# Patient Record
Sex: Female | Born: 1965 | Race: White | Hispanic: No | Smoking: Never smoker
Health system: Southern US, Community
[De-identification: ages and names within clinical notes are randomized; demographics above are authoritative.]

## PROBLEM LIST (undated history)

## (undated) DIAGNOSIS — G4730 Sleep apnea, unspecified: Secondary | ICD-10-CM

## (undated) DIAGNOSIS — T7840XA Allergy, unspecified, initial encounter: Secondary | ICD-10-CM

## (undated) DIAGNOSIS — E538 Deficiency of other specified B group vitamins: Secondary | ICD-10-CM

## (undated) DIAGNOSIS — E559 Vitamin D deficiency, unspecified: Secondary | ICD-10-CM

## (undated) DIAGNOSIS — E669 Obesity, unspecified: Secondary | ICD-10-CM

## (undated) DIAGNOSIS — J45909 Unspecified asthma, uncomplicated: Secondary | ICD-10-CM

## (undated) DIAGNOSIS — E079 Disorder of thyroid, unspecified: Secondary | ICD-10-CM

## (undated) HISTORY — PX: URETHRA SURGERY: SHX824

## (undated) HISTORY — DX: Vitamin D deficiency, unspecified: E55.9

## (undated) HISTORY — DX: Sleep apnea, unspecified: G47.30

## (undated) HISTORY — DX: Deficiency of other specified B group vitamins: E53.8

## (undated) HISTORY — DX: Allergy, unspecified, initial encounter: T78.40XA

## (undated) HISTORY — DX: Unspecified asthma, uncomplicated: J45.909

## (undated) HISTORY — DX: Obesity, unspecified: E66.9

## (undated) HISTORY — PX: WISDOM TOOTH EXTRACTION: SHX21

---

## 2000-02-14 ENCOUNTER — Other Ambulatory Visit: Admission: RE | Admit: 2000-02-14 | Discharge: 2000-02-14 | Payer: Self-pay | Admitting: Family Medicine

## 2007-03-11 ENCOUNTER — Ambulatory Visit: Payer: Self-pay | Admitting: Internal Medicine

## 2007-04-01 ENCOUNTER — Ambulatory Visit: Payer: Self-pay | Admitting: Internal Medicine

## 2009-12-20 IMAGING — US US PELV - US TRANSVAGINAL
1 series · 17 of 25 positions shown · non-contrast
Comparison: none

REASON FOR EXAM: Uterine enlargement, post void residual bladder,
recurrent UTI
COMMENTS:

[Series 1: us pelv - us transvaginal · 17 of 37 slices shown]
[im 1/37]
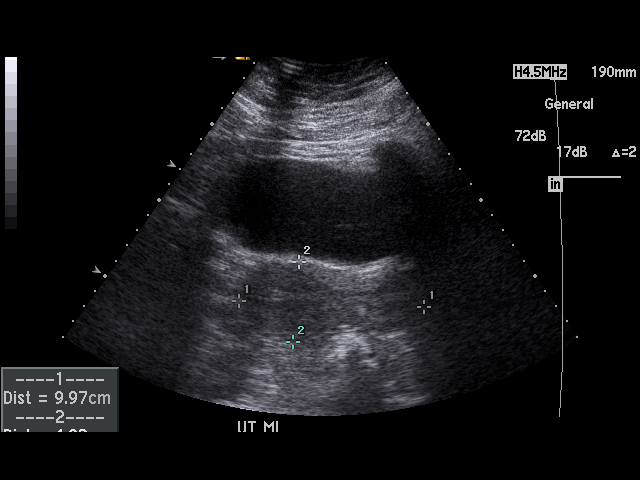
[im 4/37]
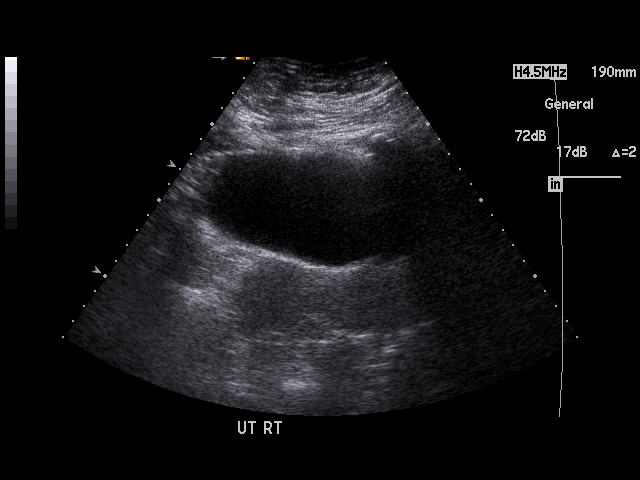
[im 5/37]
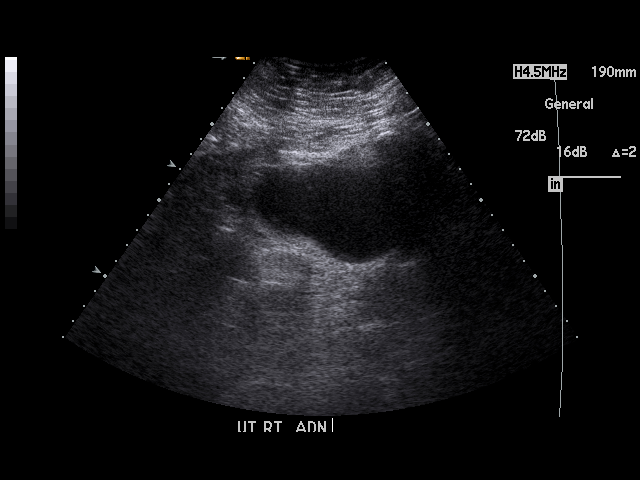
[im 8/37]
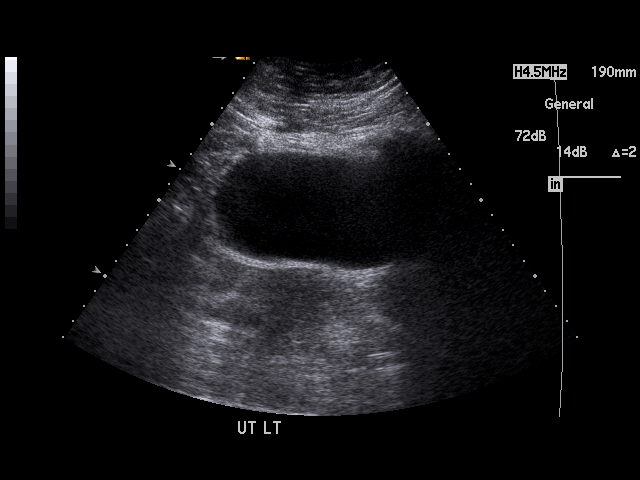
[im 10/37]
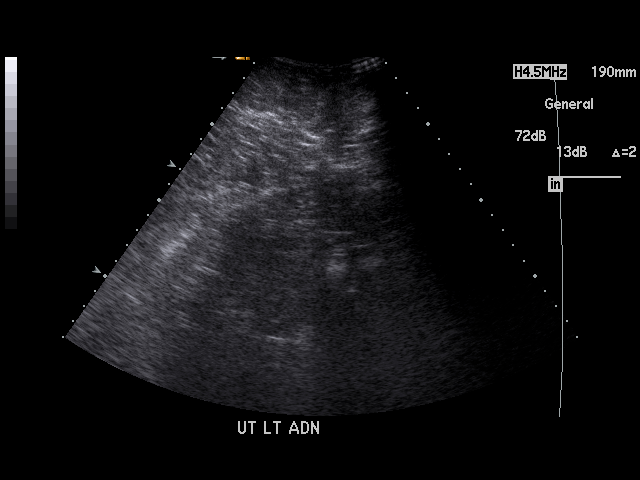
[im 13/37]
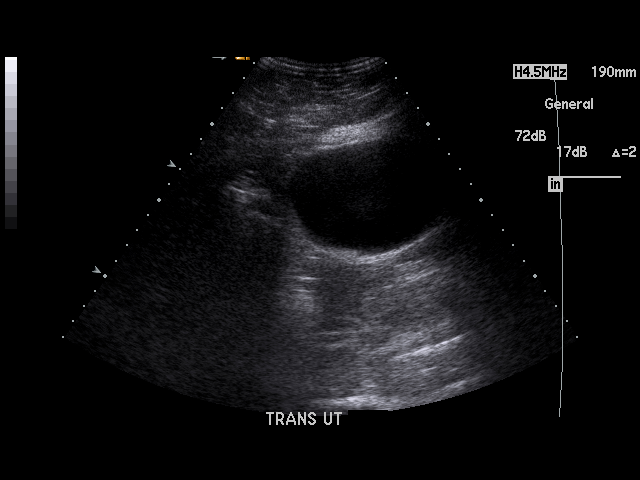
[im 14/37]
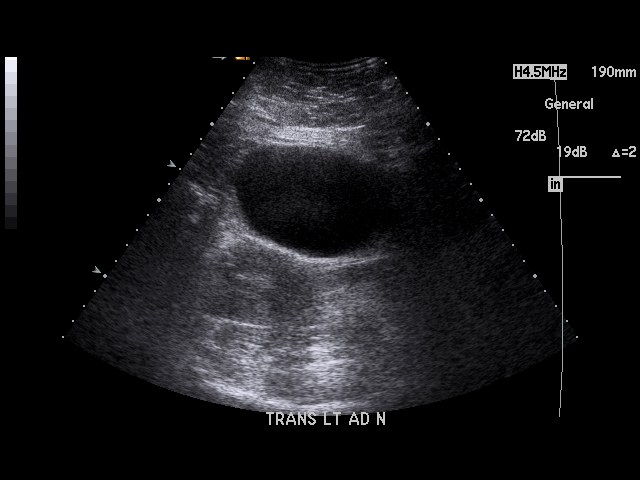
[im 17/37]
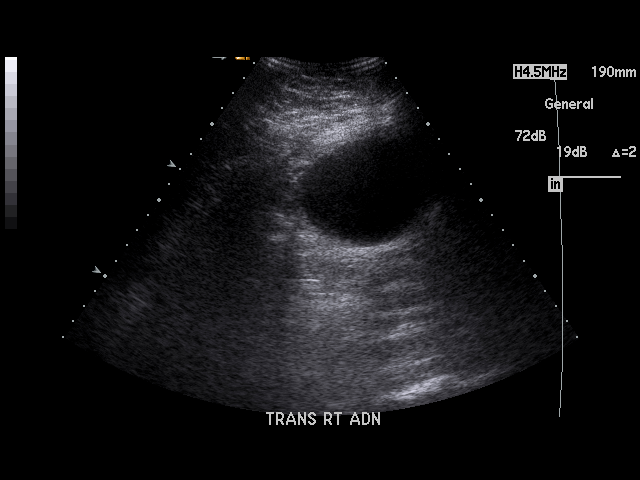
[im 19/37]
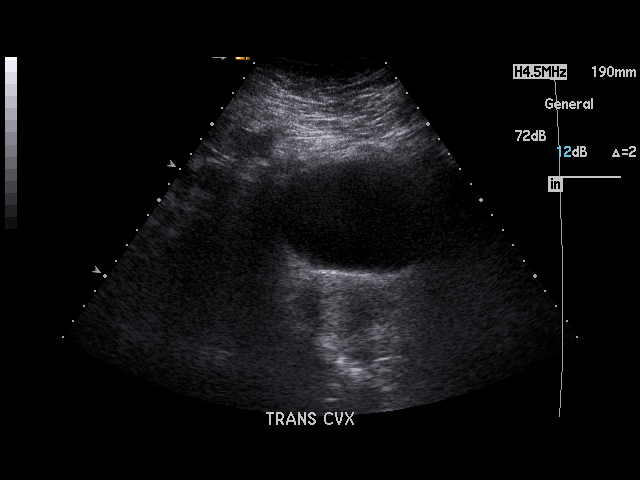
[im 20/37]
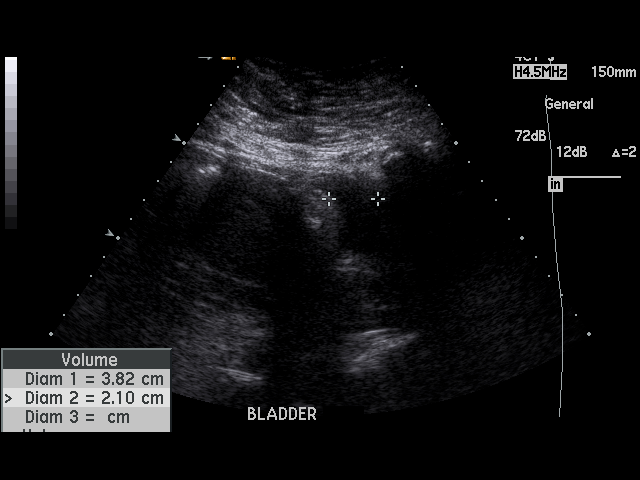
[im 23/37]
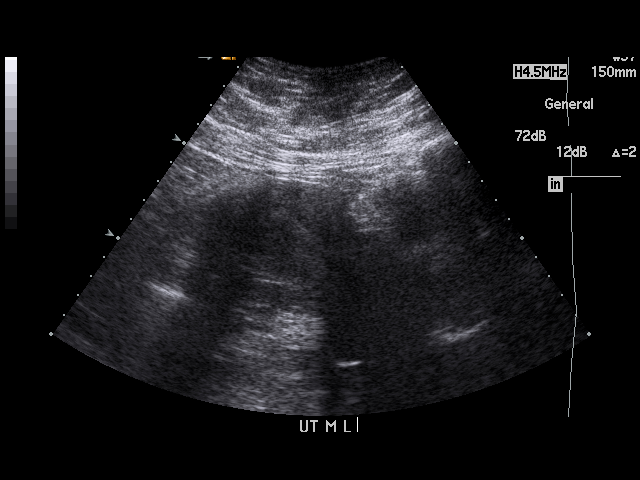
[im 25/37]
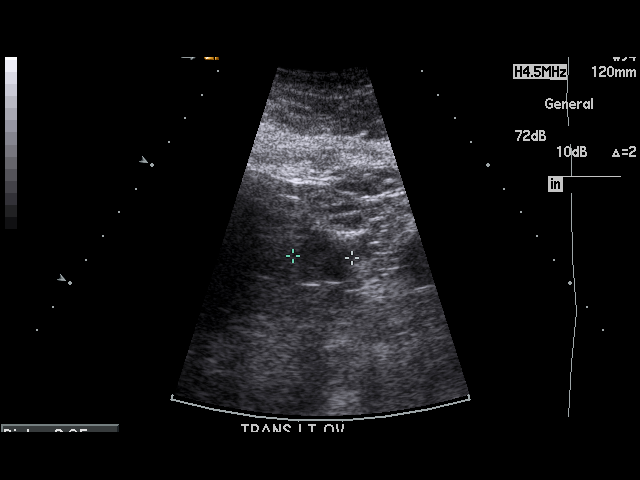
[im 28/37]
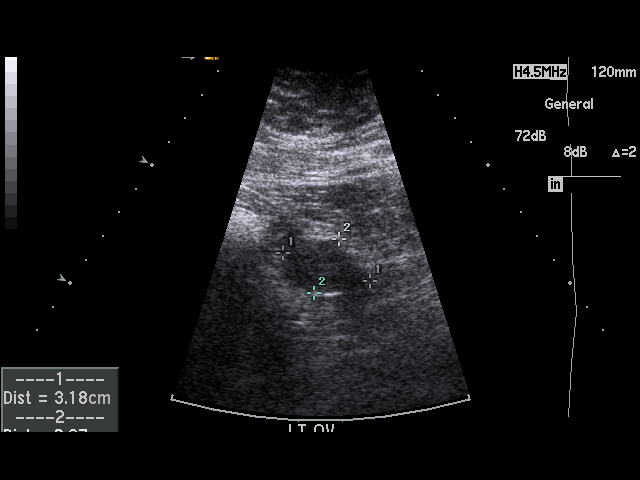
[im 29/37]
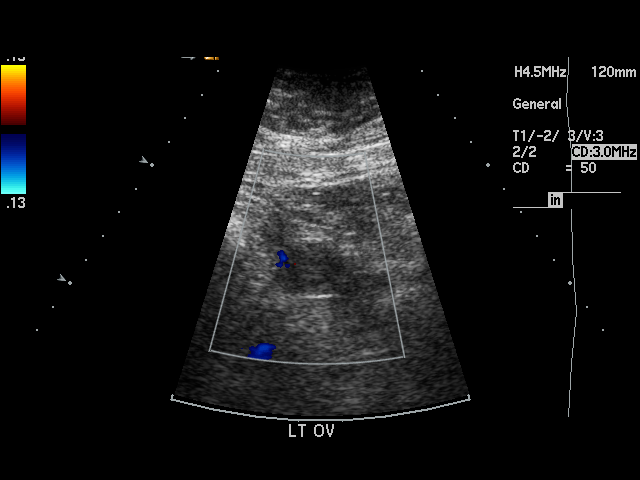
[im 32/37]
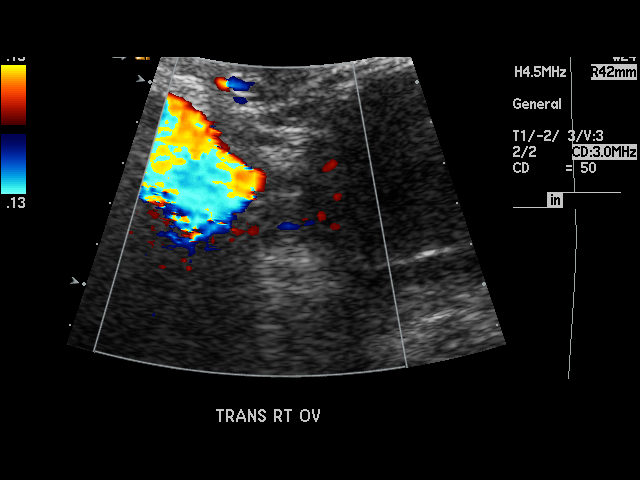
[im 34/37]
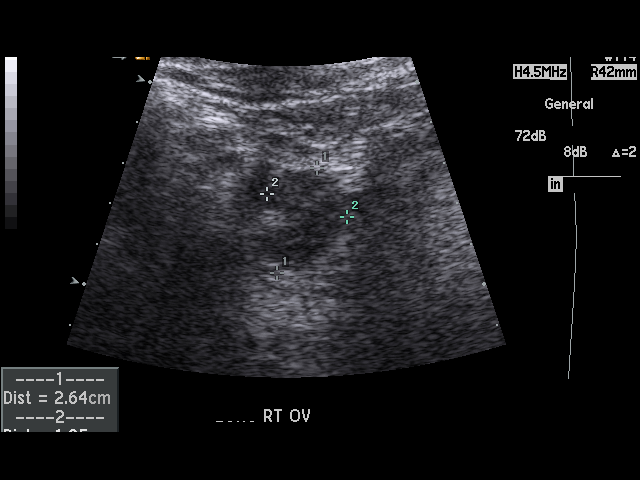
[im 37/37]
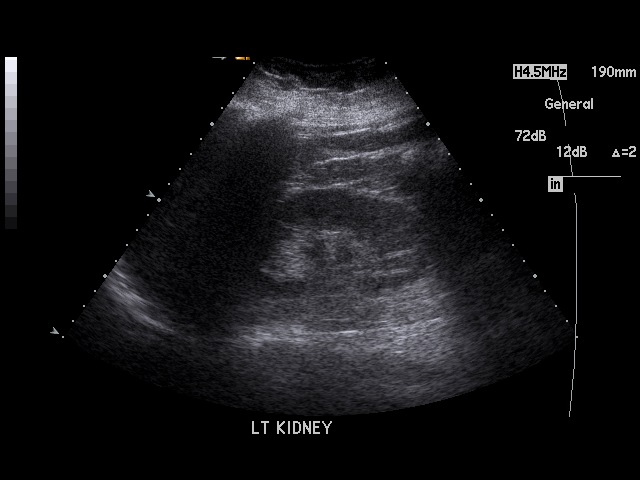

[17 of 25 positions shown; findings below may reference images not displayed]

PROCEDURE:     US  - US PELVIS MASS EXAM  - [DATE] [DATE] [DATE]  [DATE]

RESULT:     The uterus measures 9.97 cm x 5.36 cm x 7.34 cm. The endometrium
measures 7.0 mm in thickness. No uterine mass lesions are seen. The RIGHT
and LEFT ovaries are visualized. The RIGHT ovary measures 2.64 cm at maximum
diameter. The LEFT ovary measures 3.18 cm at maximum diameter. No abnormal
adnexal masses are seen. The kidneys show no hydronephrosis. The visualized
portion of the filled urinary bladder shows no significant abnormalities.
Post-void volume of the urinary bladder measures 22.51 cm.
IMPRESSION: 1.  No significant abnormalities are identified.
2.  The post-void urinary bladder volume measures 22.51 cm.

## 2011-03-06 DIAGNOSIS — G4733 Obstructive sleep apnea (adult) (pediatric): Secondary | ICD-10-CM | POA: Insufficient documentation

## 2012-02-09 ENCOUNTER — Ambulatory Visit: Payer: Self-pay | Admitting: Physician Assistant

## 2012-02-23 ENCOUNTER — Ambulatory Visit: Payer: Self-pay | Admitting: Physician Assistant

## 2012-10-18 ENCOUNTER — Encounter (HOSPITAL_COMMUNITY): Payer: Self-pay | Admitting: Emergency Medicine

## 2012-10-18 ENCOUNTER — Emergency Department (HOSPITAL_COMMUNITY)
Admission: EM | Admit: 2012-10-18 | Discharge: 2012-10-18 | Disposition: A | Discharge status: Home / Self Care | Payer: 59 | Attending: Emergency Medicine | Admitting: Emergency Medicine

## 2012-10-18 DIAGNOSIS — Y9389 Activity, other specified: Secondary | ICD-10-CM | POA: Insufficient documentation

## 2012-10-18 DIAGNOSIS — E079 Disorder of thyroid, unspecified: Secondary | ICD-10-CM | POA: Insufficient documentation

## 2012-10-18 DIAGNOSIS — T169XXA Foreign body in ear, unspecified ear, initial encounter: Secondary | ICD-10-CM | POA: Insufficient documentation

## 2012-10-18 DIAGNOSIS — Z79899 Other long term (current) drug therapy: Secondary | ICD-10-CM | POA: Insufficient documentation

## 2012-10-18 DIAGNOSIS — IMO0002 Reserved for concepts with insufficient information to code with codable children: Secondary | ICD-10-CM | POA: Insufficient documentation

## 2012-10-18 DIAGNOSIS — Y92009 Unspecified place in unspecified non-institutional (private) residence as the place of occurrence of the external cause: Secondary | ICD-10-CM | POA: Insufficient documentation

## 2012-10-18 DIAGNOSIS — T162XXA Foreign body in left ear, initial encounter: Secondary | ICD-10-CM

## 2012-10-18 HISTORY — DX: Disorder of thyroid, unspecified: E07.9

## 2012-10-18 MED ORDER — OFLOXACIN 0.3 % OT SOLN: 10.0000 [drp] | Freq: Every day | OTIC | Status: DC

## 2012-10-18 MED ORDER — OXYMETAZOLINE HCL 0.05 % NA SOLN
1.0000 | Freq: Once | NASAL | Status: AC
  Administered 2012-10-18: 1 via NASAL
  Filled 2012-10-18: qty 15

## 2012-10-18 NOTE — ED Notes (Signed)
Pt c/o having dead bug in left ear; pt sent from [facility]

## 2012-10-18 NOTE — ED Notes (Signed)
Pt states that she went to Urgent care where they used a solution to attempt to rinse the foreign body out of her ear. Urgent care was unsuccessful and she was sent to ED for further treatment. Pt states that she hears a whooshing sound in her ear since she had the solution instilled. Pt is mildly anxious and states she wants the bug out of her ear.

## 2012-10-18 NOTE — ED Notes (Signed)
Irrigation continued and using forceps PA removed foreign body. Ear canal is red and inflamed. Ear drum intact.

## 2012-10-18 NOTE — ED Provider Notes (Signed)
CSN: [id number]     Arrival date & time 10/18/12  1441 History  This chart was scribed for non-physician practitioner, Dierdre Forth, PA-C working with No att. providers found by Greggory Stallion, ED scribe. This patient was seen in room TR05C/TR05C and the patient's care was started at 3:16 PM.   Chief Complaint  Patient presents with  . Foreign Body in Ear   The history is provided by the patient and medical records. No language interpreter was used.   HPI Comments: Sarah Austin is a 47 y.o. female who presents to the Emergency Department complaining of an acute, persistent foreign body in her left ear that she noticed last night. She states she was trying to swat away a bug and accidentally swept it into her ear instead. Pt was sent here from Urgent Care because they were unable to flush it out. She has tried hydrogen peroxide and water without being able to get it out. Pt denies ear pain. Nothing makes it better or worse.    Past Medical History  Diagnosis Date  . Thyroid disease    History reviewed. No pertinent past surgical history. History reviewed. No pertinent family history. History  Substance Use Topics  . Smoking status: Never Smoker   . Smokeless tobacco: Not on file  . Alcohol Use: No   OB History   Grav Para Term Preterm Abortions TAB SAB Ect Mult Living                 Review of Systems  HENT: Negative for ear pain.   All other systems reviewed and are negative.    Allergies  Review of patient's allergies indicates no known allergies.  Home Medications   Current Outpatient Rx  Name  Route  Sig  Dispense  Refill  . albuterol (PROVENTIL HFA;VENTOLIN HFA) 108 (90 BASE) MCG/ACT inhaler   Inhalation   Inhale 2 puffs into the lungs every 6 (six) hours as needed for wheezing.         Marland Kitchen levothyroxine (SYNTHROID, LEVOTHROID) 125 MCG tablet   Oral   Take 125 mcg by mouth daily before breakfast.         . ofloxacin (FLOXIN) 0.3 % otic solution    Left Ear   Place 10 drops into the left ear daily.   5 mL   0     BP 147/84  Pulse 79  Temp(Src) 98.2 F (36.8 C) (Oral)  Resp 18  SpO2 96%  Physical Exam  Nursing note and vitals reviewed. Constitutional: She appears well-developed and well-nourished. No distress.  Awake, alert, nontoxic appearance  HENT:  Head: Normocephalic and atraumatic.  Right Ear: Hearing, tympanic membrane, external ear and ear canal normal.  Left Ear: External ear normal. A foreign body is present.  Nose: Nose normal.  Mouth/Throat: Uvula is midline, oropharynx is clear and moist and mucous membranes are normal. Mucous membranes are not dry. No oropharyngeal exudate, posterior oropharyngeal edema, posterior oropharyngeal erythema or tonsillar abscesses.  Visible insect in the canal of the left ear  Eyes: Conjunctivae are normal. No scleral icterus.  Neck: Normal range of motion. Neck supple.  Cardiovascular: Normal rate, regular rhythm, normal heart sounds and intact distal pulses.   No murmur heard. Pulmonary/Chest: Effort normal and breath sounds normal. No respiratory distress. She has no wheezes.  Abdominal: Soft. Bowel sounds are normal.  Musculoskeletal: Normal range of motion. She exhibits no edema.  Neurological: She is alert.  Speech is clear and goal oriented Moves  extremities without ataxia  Skin: Skin is warm and dry. She is not diaphoretic.  Psychiatric: She has a normal mood and affect.    ED Course  FOREIGN BODY REMOVAL Date/Time: 10/18/2012 4:00 PM Performed by: Dierdre Forth Authorized by: Dierdre Forth Consent: Verbal consent obtained. Risks and benefits: risks, benefits and alternatives were discussed Consent given by: patient Patient understanding: patient states understanding of the procedure being performed Patient consent: the patient's understanding of the procedure matches consent given Procedure consent: procedure consent matches procedure  scheduled Relevant documents: relevant documents present and verified Site marked: the operative site was marked Required items: required blood products, implants, devices, and special equipment available Patient identity confirmed: verbally with patient and arm band Time out: Immediately prior to procedure a "time out" was called to verify the correct patient, procedure, equipment, support staff and site/side marked as required. Body area: ear Location details: left ear Patient sedated: no Patient restrained: no Patient cooperative: yes Localization method: ENT speculum and visualized Removal mechanism: alligator forceps and irrigation Complexity: complex 1 objects recovered. Objects recovered: insect Post-procedure assessment: foreign body removed Patient tolerance: Patient tolerated the procedure well with no immediate complications.   (including critical care time)  DIAGNOSTIC STUDIES: Oxygen Saturation is 96% on RA, normal by my interpretation.    COORDINATION OF CARE: 3:23 PM-Discussed treatment plan which includes removing foreign body with pt at bedside and pt agreed to plan.   Labs Review Labs Reviewed - No data to display Imaging Review No results found.  EKG Interpretation   None       MDM   1. Foreign body in ear, left, initial encounter     Sarah Austin presents with ring body in the left ear. Patient with insect in the left ear removed with alligator forceps and irrigation after several attempts.    Reevaluation after foreign body removal shows the patient's left TM is erythematous but intact. Patient has a visible scratches and abrasions to the ear canal.  Patient with improved hearing after foreign body removal. Will discharge home with antibiotic drops.  Tetanus is UTD, <5 yrs.  Patient complaining of her left ear feeling "stopped up."  Patient given afrin here in the emergency department with relief of this feeling. Recommend primary care followup for  further evaluation as needed.  It has been determined that no acute conditions requiring further emergency intervention are present at this time. The patient/guardian have been advised of the diagnosis and plan. We have discussed signs and symptoms that warrant return to the ED, such as changes or worsening in symptoms.   Vital signs are stable at discharge.   BP 126/87  Pulse 72  Temp(Src) 96.9 F (36.1 C) (Oral)  Resp 19  SpO2 98%  Patient/guardian has voiced understanding and agreed to follow-up with the PCP or specialist.  I personally performed the services described in this documentation, which was scribed in my presence. The recorded information has been reviewed and is accurate.     Dahlia Client , PA-C 10/18/12 1636

## 2012-10-18 NOTE — ED Provider Notes (Signed)
  Medical screening examination/treatment/procedure(s) were performed by non-physician practitioner and as supervising physician I was immediately available for consultation/collaboration.    , MD 10/18/12 2208 

## 2013-07-14 LAB — HM PAP SMEAR: HM Pap smear: NEGATIVE

## 2013-07-14 LAB — HM MAMMOGRAPHY

## 2013-09-14 ENCOUNTER — Encounter: Payer: Self-pay | Admitting: Family Medicine

## 2013-09-14 ENCOUNTER — Ambulatory Visit (INDEPENDENT_AMBULATORY_CARE_PROVIDER_SITE_OTHER): Payer: 59 | Admitting: Family Medicine

## 2013-09-14 ENCOUNTER — Encounter (INDEPENDENT_AMBULATORY_CARE_PROVIDER_SITE_OTHER): Payer: Self-pay

## 2013-09-14 DIAGNOSIS — J4520 Mild intermittent asthma, uncomplicated: Secondary | ICD-10-CM

## 2013-09-14 DIAGNOSIS — Z833 Family history of diabetes mellitus: Secondary | ICD-10-CM

## 2013-09-14 DIAGNOSIS — J45909 Unspecified asthma, uncomplicated: Secondary | ICD-10-CM | POA: Insufficient documentation

## 2013-09-14 DIAGNOSIS — E039 Hypothyroidism, unspecified: Secondary | ICD-10-CM | POA: Insufficient documentation

## 2013-09-14 DIAGNOSIS — Z23 Encounter for immunization: Secondary | ICD-10-CM

## 2013-09-14 DIAGNOSIS — Z136 Encounter for screening for cardiovascular disorders: Secondary | ICD-10-CM

## 2013-09-14 MED ORDER — ACLIDINIUM BROMIDE 400 MCG/ACT IN AEPB: INHALATION_SPRAY | RESPIRATORY_TRACT | Status: DC

## 2013-09-14 NOTE — Patient Instructions (Addendum)
It was great to meet you. Please stop by to see Sarah Austin on your way out to set up your nutritionist appointment.  I will call you with your lab results or you can view them online.  Please come see me in 3 months.

## 2013-09-14 NOTE — Assessment & Plan Note (Signed)
Using rescue inhaler daily so does not seem to be under good control.  She just started inhaled steroid through urgent care.  Continue this for now, follow up in 3 months.  Consider restarting advair. The patient indicates understanding of these issues and agrees with the plan.

## 2013-09-14 NOTE — Progress Notes (Signed)
Pre visit review using our clinic review tool, if applicable. No additional management support is needed unless otherwise documented below in the visit note. 

## 2013-09-14 NOTE — Progress Notes (Signed)
Subjective:   Patient ID: Sarah Austin, female    DOB: 25-Feb-1965, 48 y.o.   MRN: [id number]  Sarah Austin is a pleasant 48 y.o. year old female who presents to clinic today with Establish Care  on 09/14/2013  HPI:  Hypothyroidism- has been on synthroid for year. Has been on 125 mcg daily for at least 2 years.  Not having her labs checked regularly. Having some fatigue, dry skin, more diarrhea. No other symptoms of hypo or hyperthyroidism. +FH of thyroid dysfunction.  Obesity-  Has been trying to lose weight. Walking three times a week.  Cutting back on caloric intake. Has tried "every diet"- including weight watchers.  Asthma- uses her rescue inhaler daily.  Also using aclidinium bromide- rx given to her at urgent care. Advair was effective in past.  Does have allergic asthma but has other triggers as well.  Current Outpatient Prescriptions on File Prior to Visit  Medication Sig Dispense Refill  . albuterol (PROVENTIL HFA;VENTOLIN HFA) 108 (90 BASE) MCG/ACT inhaler Inhale 2 puffs into the lungs every 6 (six) hours as needed for wheezing.      Marland Kitchen levothyroxine (SYNTHROID, LEVOTHROID) 125 MCG tablet Take 125 mcg by mouth daily before breakfast.       No current facility-administered medications on file prior to visit.    No Known Allergies  Past Medical History  Diagnosis Date  . Thyroid disease   . Asthma     Past Surgical History  Procedure Laterality Date  . Wisdom tooth extraction    . Urethra surgery      1969    Family History  Problem Relation Age of Onset  . Arthritis Mother   . Cancer Mother   . Hypertension Mother   . Diabetes Mother   . Heart disease Father   . Heart disease Sister   . Cancer Maternal Grandmother     History   Social History  . Marital Status: Married    Spouse Name: N/A    Number of Children: N/A  . Years of Education: N/A   Occupational History  . Not on file.   Social History Main Topics  . Smoking status: Never  Smoker   . Smokeless tobacco: Not on file  . Alcohol Use: No  . Drug Use: No  . Sexual Activity: No   Other Topics Concern  . Not on file   Social History Narrative  . No narrative on file   The PMH, PSH, Social History, Family History, Medications, and allergies have been reviewed in [facility], and have been updated if relevant.    Review of Systems See HPI +hot flashes +increased thirst and urination +diarrhea but no abdominal pain or blood in her stool No CP  No fevers No urinary symptoms No LE edema No HA No blurred vision     Objective:    BP 128/74  Pulse 70  Temp(Src) 97.7 F (36.5 C) (Oral)  Ht  (1.753 m)  Wt 309 lb 12 oz (140.502 kg)  BMI 45.72 kg/m2  SpO2 97%   Physical Exam  General:  Obese pleasant female, in no acute distress; alert,appropriate and cooperative throughout examination Head:  normocephalic and atraumatic.   Eyes:  vision grossly intact, pupils equal, pupils round, and pupils reactive to light.   Ears:  R ear normal and L ear normal.   Nose:  no external deformity.   Mouth:  good dentition.   Neck:  No deformities, masses, or tenderness noted.  Lungs:  Normal respiratory effort, chest expands symmetrically. Lungs are clear to auscultation, no crackles or wheezes. Heart:  Normal rate and regular rhythm. S1 and S2 normal without gallop, murmur, click, rub or other extra sounds. Abdomen:  Bowel sounds positive,abdomen soft and non-tender without masses, organomegaly or hernias noted. Msk:  No deformity or scoliosis noted of thoracic or lumbar spine.   Extremities:  No clubbing, cyanosis, edema, or deformity noted with normal full range of motion of all joints.   Neurologic:  alert & oriented X3 and gait normal.   Skin:  Intact without suspicious lesions or rashes Cervical Nodes:  No lymphadenopathy noted Axillary Nodes:  No palpable lymphadenopathy Psych:  Cognition and judgment appear intact. Alert and cooperative with normal attention  span and concentration. No apparent delusions, illusions, hallucinations        Assessment & Plan:   Morbid obesity - Plan: Comprehensive metabolic panel  Unspecified hypothyroidism - Plan: TSH, T4, Free, CBC with Differential  Screening for ischemic heart disease - Plan: Lipid panel  Need for prophylactic vaccination and inoculation against influenza - Plan: Flu Vaccine QUAD 36+ mos PF IM (Fluarix Quad PF)  Asthma, chronic, mild intermittent, uncomplicated No Follow-up on file.

## 2013-09-14 NOTE — Addendum Note (Signed)
Addended by: Liane Comber C on: 09/14/2013 02:00 PM   Modules accepted: Orders

## 2013-09-14 NOTE — Assessment & Plan Note (Signed)
Deteriorated- discussed exercise which she has been trying to do. Will also refer to nutritionist. Follow up with me in 3 months. The patient indicates understanding of these issues and agrees with the plan.

## 2013-09-14 NOTE — Assessment & Plan Note (Signed)
Pt feels she is hypothyroid- under corrected. Will check labs today. Orders Placed This Encounter  Procedures  . Flu Vaccine QUAD 36+ mos PF IM (Fluarix Quad PF)  . TSH  . T4, Free  . Comprehensive metabolic panel  . CBC with Differential  . Lipid panel  . Hemoglobin A1c  . Amb ref to Medical Nutrition Therapy-MNT

## 2013-09-14 NOTE — Addendum Note (Signed)
Addended by: Alvina Chou on: 09/14/2013 02:06 PM   Modules accepted: Orders

## 2013-09-16 ENCOUNTER — Telehealth: Payer: Self-pay

## 2013-09-16 LAB — CBC WITH DIFFERENTIAL/PLATELET
Basophils Absolute: 0 10*3/uL (ref 0.0–0.2)
Basos: 0 %
EOS: 1 %
Eosinophils Absolute: 0.1 10*3/uL (ref 0.0–0.4)
HEMATOCRIT: 38.8 % (ref 34.0–46.6)
HEMOGLOBIN: 12.9 g/dL (ref 11.1–15.9)
Immature Grans (Abs): 0 10*3/uL (ref 0.0–0.1)
Immature Granulocytes: 0 %
LYMPHS ABS: 1.8 10*3/uL (ref 0.7–3.1)
Lymphs: 24 %
MCH: 29.1 pg (ref 26.6–33.0)
MCHC: 33.2 g/dL (ref 31.5–35.7)
MCV: 88 fL (ref 79–97)
Monocytes Absolute: 0.4 10*3/uL (ref 0.1–0.9)
Monocytes: 5 %
NEUTROS ABS: 5.4 10*3/uL (ref 1.4–7.0)
Neutrophils Relative %: 70 %
RBC: 4.43 x10E6/uL (ref 3.77–5.28)
RDW: 13.7 % (ref 12.3–15.4)
WBC: 7.8 10*3/uL (ref 3.4–10.8)

## 2013-09-16 LAB — COMPREHENSIVE METABOLIC PANEL
A/G RATIO: 1.8 (ref 1.1–2.5)
ALBUMIN: 4.4 g/dL (ref 3.5–5.5)
ALK PHOS: 96 IU/L (ref 39–117)
ALT: 24 IU/L (ref 0–32)
AST: 15 IU/L (ref 0–40)
BILIRUBIN TOTAL: 0.6 mg/dL (ref 0.0–1.2)
BUN / CREAT RATIO: 11 (ref 9–23)
BUN: 10 mg/dL (ref 6–24)
CO2: 23 mmol/L (ref 18–29)
CREATININE: 0.94 mg/dL (ref 0.57–1.00)
Calcium: 9.1 mg/dL (ref 8.7–10.2)
Chloride: 103 mmol/L (ref 97–108)
GFR calc non Af Amer: 71 mL/min/{1.73_m2} (ref >59)
GFR, EST AFRICAN AMERICAN: 82 mL/min/{1.73_m2} (ref >59)
GLOBULIN, TOTAL: 2.5 g/dL (ref 1.5–4.5)
Glucose: 84 mg/dL (ref 65–99)
Potassium: 4.1 mmol/L (ref 3.5–5.2)
SODIUM: 142 mmol/L (ref 134–144)
Total Protein: 6.9 g/dL (ref 6.0–8.5)

## 2013-09-16 LAB — LIPID PANEL
Chol/HDL Ratio: 3.9 ratio units (ref 0.0–4.4)
Cholesterol, Total: 202 mg/dL — ABNORMAL HIGH (ref 100–199)
HDL: 52 mg/dL (ref >39)
LDL Calculated: 129 mg/dL — ABNORMAL HIGH (ref 0–99)
Triglycerides: 104 mg/dL (ref 0–149)
VLDL Cholesterol Cal: 21 mg/dL (ref 5–40)

## 2013-09-16 LAB — HEMOGLOBIN A1C
Est. average glucose Bld gHb Est-mCnc: 108 mg/dL
Hgb A1c MFr Bld: 5.4 % (ref 4.8–5.6)

## 2013-09-16 LAB — T4, FREE: Free T4: 1.19 ng/dL (ref 0.82–1.77)

## 2013-09-16 LAB — TSH: TSH: 1.97 u[IU]/mL (ref 0.450–4.500)

## 2013-09-16 NOTE — Telephone Encounter (Signed)
Pt request cb [phone] when lab results are back.

## 2013-09-17 ENCOUNTER — Encounter: Payer: Self-pay | Admitting: *Deleted

## 2013-09-18 NOTE — Telephone Encounter (Signed)
Her thyroid function is normal so I am not comfortable with increasing her thyroid medication dosage.  I can certainly send her to a specialist, an endocrinologist, for further evaluation and testing that would be outside my scope of practice.  Let me know and I will place referral.

## 2013-09-18 NOTE — Telephone Encounter (Signed)
Attempted to contact pt, unable to leave a message

## 2013-09-18 NOTE — Telephone Encounter (Signed)
Pt request recent lab results; pt notified as instructed from result note and advised pt copy of labs had been mailed to pt. Pt said she is feeling very tired and wants to know if thyroid med needs to be increased; pt is presently taking 125 mcg. Pt request cb. [facility].

## 2013-09-22 NOTE — Telephone Encounter (Signed)
Pt called back and advised as instructed. Pt feels that thyroid testing is on low end of normal and thinks thyroid med needs to be increased; pt will ck about endocrinologist and decide who she wants to see and if needs referral pt will cb.

## 2013-09-25 ENCOUNTER — Telehealth: Payer: Self-pay

## 2013-09-25 DIAGNOSIS — E039 Hypothyroidism, unspecified: Secondary | ICD-10-CM

## 2013-09-25 NOTE — Telephone Encounter (Signed)
Referral placed.

## 2013-09-25 NOTE — Telephone Encounter (Signed)
Pt left v/m requesting referral to [facility] endocrinologist at [facility] to get thyroid adjusted.Please advise.

## 2013-12-14 ENCOUNTER — Ambulatory Visit: Payer: 59 | Admitting: Family Medicine

## 2014-01-11 ENCOUNTER — Ambulatory Visit (INDEPENDENT_AMBULATORY_CARE_PROVIDER_SITE_OTHER): Payer: 59 | Admitting: Family Medicine

## 2014-01-11 ENCOUNTER — Encounter: Payer: Self-pay | Admitting: Family Medicine

## 2014-01-11 VITALS — BP 130/74 | HR 81 | Temp 97.6°F | Wt 316.5 lb

## 2014-01-11 DIAGNOSIS — R350 Frequency of micturition: Secondary | ICD-10-CM

## 2014-01-11 LAB — POCT URINALYSIS DIPSTICK
Bilirubin, UA: NEGATIVE
Glucose, UA: NEGATIVE
Ketones, UA: NEGATIVE
LEUKOCYTES UA: NEGATIVE
Nitrite, UA: NEGATIVE
Protein, UA: NEGATIVE
Spec Grav, UA: >=1.03
Urobilinogen, UA: 0.2
pH, UA: 6

## 2014-01-11 MED ORDER — PHENTERMINE HCL 15 MG PO CAPS: 15.0000 mg | ORAL_CAPSULE | ORAL | Status: DC

## 2014-01-11 MED ORDER — CIPROFLOXACIN HCL 250 MG PO TABS: 250.0000 mg | ORAL_TABLET | Freq: Two times a day (BID) | ORAL | Status: DC

## 2014-01-11 NOTE — Progress Notes (Signed)
Patient ID: Sarah Austin, female   DOB: 25-Sep-1965, 49 y.o.   MRN: [id number]   Subjective:   Patient ID: Sarah Austin, female    DOB: [birth year], 49 y.o.   MRN: [id number]  Sarah Austin is a pleasant 49 y.o. year old female who presents to clinic today with Follow-up and Urinary Frequency  on 01/11/2014  HPI: I have not seen her since she established care with me in 09/2013.  Morbid obesity- trying to lose weight.  I referred her to a nutritionist at our last office visit.  She did not feel it was helpful- she thought it would be one on one but it was a group class.  Wants to talk about other options.  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 Wt Readings from Last 3 Encounters:  01/11/14 316 lb 8 oz (143.563 kg)  09/14/13 309 lb 12 oz (140.502 kg)     Lab Results  Component Value Date   HGBA1C 5.4 09/14/2013     Urinary frequency- 3 days of urinary frequency, dysuria, back pain.  No fevers.  Hypothyroidism- Taking synthroid 125 mcg daily. Lab Results  Component Value Date   TSH 1.970 09/14/2013   Denies any symptoms of hypo or hyperthyroidism.  Current Outpatient Prescriptions on File Prior to Visit  Medication Sig Dispense Refill  . Aclidinium Bromide 400 MCG/ACT AEPB 1 puff daily 1 each 3  . albuterol (PROVENTIL HFA;VENTOLIN HFA) 108 (90 BASE) MCG/ACT inhaler Inhale 2 puffs into the lungs every 6 (six) hours as needed for wheezing.    Marland Kitchen levothyroxine (SYNTHROID, LEVOTHROID) 125 MCG tablet Take 125 mcg by mouth daily before breakfast.     No current facility-administered medications on file prior to visit.    No Known Allergies  Past Medical History  Diagnosis Date  . Thyroid disease   . Asthma     Past Surgical History  Procedure Laterality Date  . Wisdom tooth extraction    . Urethra surgery      1969    Family History  Problem Relation Age of Onset  . Arthritis Mother   . Cancer Mother   . Hypertension Mother   . Diabetes Mother   . Heart disease Father    . Heart disease Sister   . Cancer Maternal Grandmother     History   Social History  . Marital Status: Divorced    Spouse Name: N/A    Number of Children: N/A  . Years of Education: N/A   Occupational History  . Not on file.   Social History Main Topics  . Smoking status: Never Smoker   . Smokeless tobacco: Not on file  . Alcohol Use: No  . Drug Use: No  . Sexual Activity: No   Other Topics Concern  . Not on file   Social History Narrative   The PMH, PSH, Social History, Family History, Medications, and allergies have been reviewed in [facility], and have been updated if relevant.    Review of Systems  Constitutional: Negative.   HENT: Negative.   Respiratory: Negative.   Cardiovascular: Negative.   Genitourinary: Positive for dysuria, urgency and flank pain. Negative for vaginal bleeding, vaginal discharge and pelvic pain.  Musculoskeletal: Negative.   Skin: Negative.   Allergic/Immunologic: Negative.   Neurological: Negative.   Hematological: Negative.   Psychiatric/Behavioral: Negative.   All other systems reviewed and are negative.      Objective:    BP 130/74 mmHg  Pulse 81  Temp(Src) 97.6  F (36.4 C) (Oral)  Wt 316 lb 8 oz (143.563 kg)  SpO2 96%   Physical Exam  Constitutional: She is oriented to person, place, and time. She appears well-developed and well-nourished.  Morbidly obese  HENT:  Head: Normocephalic.  Eyes: Conjunctivae are normal.  Neck: Normal range of motion.  Cardiovascular: Normal rate and regular rhythm.   Pulmonary/Chest: Effort normal and breath sounds normal.  Abdominal: Soft. Bowel sounds are normal.  Mild suprapubic tenderness No CVA tenderness  Musculoskeletal: Normal range of motion.  Neurological: She is alert and oriented to person, place, and time.  Skin: Skin is warm and dry.  Psychiatric: She has a normal mood and affect. Her behavior is normal. Judgment and thought content normal.          Assessment &  Plan:   Urinary frequency - Plan: Urinalysis Dipstick No Follow-up on file.

## 2014-01-11 NOTE — Assessment & Plan Note (Signed)
Deteriorated. Discussed weight loss plan.  Pt would also like to discuss medication options- discussed phentermine risk benefits, side effects including HTN, pulmonary HTN, stroke.    She would like to start phentermine and lifestyle changes.  Follow up in 1 month.  If BMI < 27 will decrease to half dose x 1 month then stop

## 2014-01-11 NOTE — Progress Notes (Signed)
Pre visit review using our clinic review tool, if applicable. No additional management support is needed unless otherwise documented below in the visit note. 

## 2014-01-11 NOTE — Assessment & Plan Note (Signed)
New- UA pos for RBCs and has classic UTI symptoms. Treat with cipro 500 mg twice daily x 3 days, send urine for cx. Call or return to clinic prn if these symptoms worsen or fail to improve as anticipated. The patient indicates understanding of these issues and agrees with the plan.

## 2014-01-11 NOTE — Patient Instructions (Signed)
Great to see you. We are starting phentermine 15 mg daily- please come see me in one month.  We are also starting cipro 500 mg twice daily x 3 days.  We will call you with your urine culture results.

## 2014-01-13 LAB — URINE CULTURE
Colony Count: NO GROWTH
ORGANISM ID, BACTERIA: NO GROWTH

## 2014-01-14 ENCOUNTER — Other Ambulatory Visit: Payer: Self-pay

## 2014-01-14 NOTE — Telephone Encounter (Signed)
Pt left v/m; pt seen 01/11/14 and pt finished abx this morning and pt request refill abx due to still experiencing some discomfort. Please advise. [facility].

## 2014-02-22 ENCOUNTER — Ambulatory Visit: Payer: 59 | Admitting: Family Medicine

## 2014-03-22 ENCOUNTER — Ambulatory Visit: Payer: 59 | Admitting: Family Medicine

## 2014-05-19 HISTORY — PX: ENDOMETRIAL BIOPSY: SHX622

## 2014-06-15 ENCOUNTER — Encounter: Payer: Self-pay | Admitting: Obstetrics and Gynecology

## 2014-06-15 ENCOUNTER — Ambulatory Visit (INDEPENDENT_AMBULATORY_CARE_PROVIDER_SITE_OTHER): Payer: 59 | Admitting: Obstetrics and Gynecology

## 2014-06-15 VITALS — BP 133/84 | HR 71 | Ht 69.0 in | Wt 315.3 lb

## 2014-06-15 DIAGNOSIS — N840 Polyp of corpus uteri: Secondary | ICD-10-CM | POA: Diagnosis not present

## 2014-06-15 DIAGNOSIS — N950 Postmenopausal bleeding: Secondary | ICD-10-CM | POA: Diagnosis not present

## 2014-06-15 DIAGNOSIS — J302 Other seasonal allergic rhinitis: Secondary | ICD-10-CM | POA: Insufficient documentation

## 2014-06-15 DIAGNOSIS — R638 Other symptoms and signs concerning food and fluid intake: Secondary | ICD-10-CM

## 2014-06-15 NOTE — Progress Notes (Signed)
Patient ID: Sarah Austin, female   DOB: 04/07/1965, 49 y.o.   MRN: [id number]   REFERRAL  FROM [facility] BLEEDING X 1- NEG ENDOMETRIAL BX-05/19/2014- benign endometrial polyps  fsh- 76.4 U/S LAST YEAR- FIBROIDS SEEN WANTS D&C   GYN ENCOUNTER NOTE  Subjective:       Sarah Austin is a 49 y.o. No obstetric history on file. female is here for gynecologic evaluation of the following issues:  1. Postmenopausal bleeding. 2.  Endometrial polyp on endometrial biopsy.  The patient is a 49 year old white divorced female, para 66, who presents in referral from Dr. Willeen Cass for evaluation and management of postmenopausal bleeding. Patient had episode of heavy bleeding with clots and cramps following an 8 month period of amenorrhea.  FSH level was 78, consistent with menopause.  Endometrial biopsy demonstrated fragments of endometrial polyp.  Patient was advised to proceed with hysteroscopy/D&C.  Patient has family history of endometrial cancer in mother.  No history of colon, ovary or breast cancer in her family.   Gynecologic History Patient's last menstrual period was 07/01/2013. Contraception: post menopausal status   Menarche age 64. Cycles were regular until 1 year ago when they became infrequent and irregular. No history of dysmenorrhea. No history of abnormal Pap smears. Ultrasound approximately 1 year ago was notable for fibroids and polyps.(Record unavailable for review)  Obstetric History OB History  No data available    Past Medical History  Diagnosis Date  . Thyroid disease   . Asthma   . Obesity   . Allergy     Past Surgical History  Procedure Laterality Date  . Wisdom tooth extraction    . Urethra surgery      1969  . Endometrial biopsy  05/19/2014    at [facility]- benign endometrial polyps    Current Outpatient Prescriptions on File Prior to Visit  Medication Sig Dispense Refill  . levothyroxine (SYNTHROID, LEVOTHROID) 125 MCG tablet Take 150 mcg by mouth daily  before breakfast.      No current facility-administered medications on file prior to visit.    No Known Allergies  History   Social History  . Marital Status: Divorced    Spouse Name: N/A  . Number of Children: N/A  . Years of Education: N/A   Occupational History  . Not on file.   Social History Main Topics  . Smoking status: Never Smoker   . Smokeless tobacco: Not on file  . Alcohol Use: No  . Drug Use: No  . Sexual Activity: No   Other Topics Concern  . Not on file   Social History Narrative    Family History  Problem Relation Age of Onset  . Arthritis Mother   . Cancer Mother   . Hypertension Mother   . Diabetes Mother   . Uterine cancer Mother   . Heart disease Father   . Heart disease Sister   . Cancer Maternal Grandmother   . Breast cancer Neg Hx   . Ovarian cancer Neg Hx   . Colon cancer Neg Hx     The following portions of the patient's history were reviewed and updated as appropriate: allergies, current medications, past family history, past medical history, past social history, past surgical history and problem list.  Review of Systems Review of Systems - General ROS: negative for - chills, fatigue, fever, hot flashes, malaise or night sweats Hematological and Lymphatic ROS: negative for - bleeding problems or swollen lymph nodes Gastrointestinal ROS: negative for - abdominal  pain, blood in stools, change in bowel habits and nausea/vomiting Musculoskeletal ROS: negative for - joint pain, muscle pain or muscular weakness Genito-Urinary ROS: negative for -  dysmenorrhea, dyspareunia, dysuria, genital discharge, genital ulcers, hematuria, incontinence, irregular/heavy menses, nocturia or pelvic pain. POSITIVE for postmenopausal bleeding.  Objective:   BP 133/84 mmHg  Pulse 71  Ht  (1.753 m)  Wt 315 lb 4.8 oz (143.019 kg)  BMI 46.54 kg/m2  LMP 07/01/2013 CONSTITUTIONAL: Well-developed, well-nourished female in no acute distress.  Physical  exam deferred until preop appointment.    Assessment:   1. Postmenopausal bleeding 2.  History of endometrial polyp on biopsy. 3.  Increased BMI. 4.  Family history of endometrial cancer   Plan:  1.  Schedule hysteroscopy/D&C with polypectomy. 2.  Literature regarding hysteroscopy/D&C and abnormal uterine bleeding. 3.  Return for preop appointment  A total of 30 minutes were spent face-to-face with the patient during the encounter with greater than 50% dealing with counseling and coordination of care.

## 2014-07-07 ENCOUNTER — Ambulatory Visit: Admission: RE | Admit: 2014-07-07 | Payer: 59 | Source: Ambulatory Visit

## 2014-07-07 ENCOUNTER — Ambulatory Visit (INDEPENDENT_AMBULATORY_CARE_PROVIDER_SITE_OTHER): Payer: 59 | Admitting: Obstetrics and Gynecology

## 2014-07-07 ENCOUNTER — Ambulatory Visit
Admission: RE | Admit: 2014-07-07 | Discharge: 2014-07-07 | Disposition: A | Discharge status: Home / Self Care | Payer: 59 | Source: Ambulatory Visit | Attending: Obstetrics and Gynecology | Admitting: Obstetrics and Gynecology

## 2014-07-07 ENCOUNTER — Encounter
Admission: RE | Admit: 2014-07-07 | Discharge: 2014-07-07 | Disposition: A | Discharge status: Home / Self Care | Payer: 59 | Source: Ambulatory Visit | Attending: Obstetrics and Gynecology | Admitting: Obstetrics and Gynecology

## 2014-07-07 ENCOUNTER — Encounter: Payer: Self-pay | Admitting: Obstetrics and Gynecology

## 2014-07-07 VITALS — BP 118/78 | HR 65 | Ht 69.0 in | Wt 312.4 lb

## 2014-07-07 DIAGNOSIS — Z01818 Encounter for other preprocedural examination: Secondary | ICD-10-CM | POA: Diagnosis present

## 2014-07-07 DIAGNOSIS — N950 Postmenopausal bleeding: Secondary | ICD-10-CM

## 2014-07-07 DIAGNOSIS — J42 Unspecified chronic bronchitis: Secondary | ICD-10-CM | POA: Insufficient documentation

## 2014-07-07 DIAGNOSIS — E669 Obesity, unspecified: Secondary | ICD-10-CM | POA: Insufficient documentation

## 2014-07-07 DIAGNOSIS — J45909 Unspecified asthma, uncomplicated: Secondary | ICD-10-CM

## 2014-07-07 DIAGNOSIS — N840 Polyp of corpus uteri: Secondary | ICD-10-CM

## 2014-07-07 LAB — CBC WITH DIFFERENTIAL/PLATELET
Basophils Absolute: 0 10*3/uL (ref 0–0.1)
Basophils Relative: 0 %
EOS PCT: 1 %
Eosinophils Absolute: 0 10*3/uL (ref 0–0.7)
HEMATOCRIT: 38.9 % (ref 35.0–47.0)
Hemoglobin: 12.8 g/dL (ref 12.0–16.0)
LYMPHS ABS: 1.2 10*3/uL (ref 1.0–3.6)
LYMPHS PCT: 18 %
MCH: 28.8 pg (ref 26.0–34.0)
MCHC: 32.8 g/dL (ref 32.0–36.0)
MCV: 87.8 fL (ref 80.0–100.0)
MONOS PCT: 5 %
Monocytes Absolute: 0.3 10*3/uL (ref 0.2–0.9)
Neutro Abs: 5.2 10*3/uL (ref 1.4–6.5)
Neutrophils Relative %: 76 %
Platelets: 148 10*3/uL — ABNORMAL LOW (ref 150–440)
RBC: 4.43 MIL/uL (ref 3.80–5.20)
RDW: 13.9 % (ref 11.5–14.5)
WBC: 6.9 10*3/uL (ref 3.6–11.0)

## 2014-07-07 LAB — RAPID HIV SCREEN (HIV 1/2 AB+AG)
HIV 1/2 ANTIBODIES: NONREACTIVE
HIV-1 P24 Antigen - HIV24: NONREACTIVE

## 2014-07-07 NOTE — Progress Notes (Signed)
Patient ID: Sarah Austin, female   DOB: 07/06/1965, 49 y.o.   MRN: [id number]   Pre-op - hysteroscopy and d&c- 07/12/2014 d/t PMB  Preoperative history and physical   Date of surgery: 07/12/2014 Chief complaint: 1.  Postmenopausal bleeding. 2.  Endometrial polyps on endometrial biopsy   Patient is a 49 y.o. No obstetric history on file.female scheduled for hysteroscopy/D&C. Indications for procedure are postmenopausal bleeding and endometrial polyps on endometrial biopsy.  The patient is a 49-year-old white divorced female, para 1001, who presents in referral from Dr. Rosenow for evaluation and management of postmenopausal bleeding. Patient had episode of heavy bleeding with clots and cramps following an 8 month period of amenorrhea. FSH level was 78, consistent with menopause. Endometrial biopsy demonstrated fragments of endometrial polyp. Patient was advised to proceed with hysteroscopy/D&C. Patient has family history of endometrial cancer in mother. No history of colon, ovary or breast cancer in her family.   Pertinent Gynecological History: Per HPI  Discussed Blood/Blood Products: no   Menstrual History: OB History    No data available      Menarche age: unknown Patient's last menstrual period was 07/01/2013.    Past Medical History  Diagnosis Date  . Thyroid disease   . Asthma   . Obesity   . Allergy   . Sleep apnea     Past Surgical History  Procedure Laterality Date  . Wisdom tooth extraction    . Urethra surgery      1969  . Endometrial biopsy  05/19/2014    at [facility]- benign endometrial polyps    OB History  No data available    History   Social History  . Marital Status: Divorced    Spouse Name: N/A  . Number of Children: N/A  . Years of Education: N/A   Social History Main Topics  . Smoking status: Never Smoker   . Smokeless tobacco: Not on file  . Alcohol Use: No  . Drug Use: No  . Sexual Activity: No   Other Topics Concern  . None    Social History Narrative    Family History  Problem Relation Age of Onset  . Arthritis Mother   . Cancer Mother   . Hypertension Mother   . Diabetes Mother   . Uterine cancer Mother   . Heart disease Father   . Heart disease Sister   . Cancer Maternal Grandmother   . Breast cancer Neg Hx   . Ovarian cancer Neg Hx   . Colon cancer Neg Hx      (Not in a hospital admission)  No Known Allergies  Review of Systems Constitutional: No recent fever/chills/sweats Respiratory: No recent cough/bronchitis Cardiovascular: No chest pain Gastrointestinal: No recent nausea/vomiting/diarrhea Genitourinary: No UTI symptoms Hematologic/lymphatic:No history of coagulopathy or recent blood thinner use    Objective:    BP 118/78 mmHg  Pulse 65  Ht 5' 9" (1.753 m)  Wt 312 lb 6.4 oz (141.704 kg)  BMI 46.11 kg/m2  LMP 07/01/2013  General:   Normal  Skin:   normal  HEENT:  Normal  Neck:  Supple without Adenopathy or Thyromegaly  Lungs:   Heart:              Breasts:   Abdomen:  Pelvis:  M/S   Extremeties:  Neuro:    clear to auscultation bilaterally   Normal without murmur   Not Examined   soft, non-tender; bowel sounds normal; no masses,  no organomegaly   External genitalia-normal.   BUS-normal. Vagina-normal. Cervix-no gross lesions; light bloody discharge present. Uterus-midline, normal size and shape, nontender. Adnexa-nonpalpable and nontender  No CVAT  Warm/Dry   Normal          Assessment:      1. Postmenopausal bleeding 2.  Endometrial polyps on endometrial biopsy   Plan:  1.  Hysteroscopy/D&C.  Preoperative counseling: The patient has been counseled regarding the planned procedure.  She is understanding of the planned procedure and is aware of and is accepting of all surgical risks which include but are not limited to bleeding, infection, pelvic organ injury, need for repair, uterine perforation, anesthesia risks, etc.  All questions have been answered.   Informed consent is given.  Patient is ready and willing to proceed with surgery as scheduled.

## 2014-07-07 NOTE — Patient Instructions (Signed)
  Your procedure is scheduled on: 07/12/14 Mon  Report to Day Surgery. To find out your arrival time please call [phone] between 1PM - 3PM on 07/09/14 Friday  Remember: Instructions that are not followed completely may result in serious medical risk, up to and including death, or upon the discretion of your surgeon and anesthesiologist your surgery may need to be rescheduled.    _x___ 1. Do not eat food or drink liquids after midnight. No gum chewing or hard candies.     ____ 2. No Alcohol for 24 hours before or after surgery.   ____ 3. Bring all medications with you on the day of surgery if instructed.    _x___ 4. Notify your doctor if there is any change in your medical condition     (cold, fever, infections).     Do not wear jewelry, make-up, hairpins, clips or nail polish.  Do not wear lotions, powders, or perfumes. You may wear deodorant.  Do not shave 48 hours prior to surgery. Men may shave face and neck.  Do not bring valuables to the hospital.    [facility] any belongings or valuables.               Contacts, dentures or bridgework may not be worn into surgery.  Leave your suitcase in the car. After surgery it may be brought to your room.  For patients admitted to the hospital, discharge time is determined by your                treatment team.   Patients discharged the day of surgery will not be allowed to drive home.   Please read over the following fact sheets that you were given:      _x___ Take these medicines the morning of surgery with A SIP OF WATER:    1. levothyroxine (SYNTHROID, LEVOTHROID) 125 MCG tablet  2. Naltrexone-Bupropion HCl ER (CONTRAVE) 8-90 MG TB12  3.   4.  5.  6.  ____ Fleet Enema (as directed)   ____ Use CHG Soap as directed  _x___ Use inhalers on the day of surgery beclomethasone (QVAR) 80 MCG/ACT inhaler  ____ Stop metformin 2 days prior to surgery    ____ Take 1/2 of usual insulin dose the night before  surgery and none on the morning of surgery.   ____ Stop Coumadin/Plavix/aspirin on   ____ Stop Anti-inflammatories on    ____ Stop supplements until after surgery.    ____ Bring C-Pap to the hospital.

## 2014-07-08 LAB — RPR: RPR: NONREACTIVE

## 2014-07-12 ENCOUNTER — Encounter
Admission: RE | Disposition: A | Discharge status: Home / Self Care | Payer: Self-pay | Source: Ambulatory Visit | Attending: Obstetrics and Gynecology

## 2014-07-12 ENCOUNTER — Ambulatory Visit
Admission: RE | Admit: 2014-07-12 | Discharge: 2014-07-12 | Disposition: A | Discharge status: Home / Self Care | Payer: 59 | Source: Ambulatory Visit | Attending: Obstetrics and Gynecology | Admitting: Obstetrics and Gynecology

## 2014-07-12 ENCOUNTER — Encounter: Payer: Self-pay | Admitting: *Deleted

## 2014-07-12 ENCOUNTER — Ambulatory Visit: Payer: 59 | Admitting: *Deleted

## 2014-07-12 DIAGNOSIS — E039 Hypothyroidism, unspecified: Secondary | ICD-10-CM | POA: Diagnosis not present

## 2014-07-12 DIAGNOSIS — N840 Polyp of corpus uteri: Secondary | ICD-10-CM

## 2014-07-12 DIAGNOSIS — Z6841 Body Mass Index (BMI) 40.0 and over, adult: Secondary | ICD-10-CM | POA: Insufficient documentation

## 2014-07-12 DIAGNOSIS — J45909 Unspecified asthma, uncomplicated: Secondary | ICD-10-CM | POA: Insufficient documentation

## 2014-07-12 DIAGNOSIS — Z8049 Family history of malignant neoplasm of other genital organs: Secondary | ICD-10-CM | POA: Diagnosis not present

## 2014-07-12 DIAGNOSIS — G4730 Sleep apnea, unspecified: Secondary | ICD-10-CM | POA: Insufficient documentation

## 2014-07-12 DIAGNOSIS — N950 Postmenopausal bleeding: Secondary | ICD-10-CM | POA: Insufficient documentation

## 2014-07-12 HISTORY — PX: HYSTEROSCOPY W/D&C: SHX1775

## 2014-07-12 SURGERY — DILATATION AND CURETTAGE /HYSTEROSCOPY
Anesthesia: General | Wound class: Clean Contaminated

## 2014-07-12 MED ORDER — IBUPROFEN 600 MG PO TABS: 600.0000 mg | ORAL_TABLET | Freq: Four times a day (QID) | ORAL | Status: DC | PRN

## 2014-07-12 MED ORDER — LIDOCAINE HCL (CARDIAC) 20 MG/ML IV SOLN
INTRAVENOUS | Status: DC | PRN
  Administered 2014-07-12: 100 mg via INTRAVENOUS

## 2014-07-12 MED ORDER — FENTANYL CITRATE (PF) 100 MCG/2ML IJ SOLN
INTRAMUSCULAR | Status: AC
  Administered 2014-07-12: 25 ug via INTRAVENOUS
  Filled 2014-07-12: qty 2

## 2014-07-12 MED ORDER — HYDROMORPHONE HCL 1 MG/ML IJ SOLN: 0.2500 mg | INTRAMUSCULAR | Status: DC | PRN

## 2014-07-12 MED ORDER — FAMOTIDINE 20 MG PO TABS
ORAL_TABLET | ORAL | Status: AC
  Administered 2014-07-12: 20 mg via ORAL
  Filled 2014-07-12: qty 1

## 2014-07-12 MED ORDER — IBUPROFEN 800 MG PO TABS: 800.0000 mg | ORAL_TABLET | Freq: Three times a day (TID) | ORAL | Status: DC

## 2014-07-12 MED ORDER — OXYCODONE-ACETAMINOPHEN 5-325 MG PO TABS: 1.0000 | ORAL_TABLET | ORAL | Status: DC | PRN

## 2014-07-12 MED ORDER — FENTANYL CITRATE (PF) 100 MCG/2ML IJ SOLN
INTRAMUSCULAR | Status: DC | PRN
  Administered 2014-07-12 (×2): 50 ug via INTRAVENOUS

## 2014-07-12 MED ORDER — PROPOFOL 10 MG/ML IV BOLUS
INTRAVENOUS | Status: DC | PRN
  Administered 2014-07-12: 200 mg via INTRAVENOUS
  Administered 2014-07-12: 50 mg via INTRAVENOUS

## 2014-07-12 MED ORDER — LACTATED RINGERS IV SOLN
INTRAVENOUS | Status: DC
  Administered 2014-07-12: 14:00:00 via INTRAVENOUS

## 2014-07-12 MED ORDER — SUCCINYLCHOLINE CHLORIDE 20 MG/ML IJ SOLN
INTRAMUSCULAR | Status: DC | PRN
Start: 2014-07-12 — End: 2014-07-12
  Administered 2014-07-12: 100 mg via INTRAVENOUS

## 2014-07-12 MED ORDER — MIDAZOLAM HCL 2 MG/2ML IJ SOLN
INTRAMUSCULAR | Status: DC | PRN
  Administered 2014-07-12: 2 mg via INTRAVENOUS

## 2014-07-12 MED ORDER — LACTATED RINGERS IV SOLN: INTRAVENOUS | Status: DC

## 2014-07-12 MED ORDER — FENTANYL CITRATE (PF) 100 MCG/2ML IJ SOLN
25.0000 ug | INTRAMUSCULAR | Status: DC | PRN
  Administered 2014-07-12: 25 ug via INTRAVENOUS

## 2014-07-12 MED ORDER — ONDANSETRON HCL 4 MG/2ML IJ SOLN
INTRAMUSCULAR | Status: DC | PRN
  Administered 2014-07-12: 4 mg via INTRAVENOUS

## 2014-07-12 MED ORDER — ROCURONIUM BROMIDE 100 MG/10ML IV SOLN
INTRAVENOUS | Status: DC | PRN
  Administered 2014-07-12: 10 mg via INTRAVENOUS

## 2014-07-12 MED ORDER — FAMOTIDINE 20 MG PO TABS
20.0000 mg | ORAL_TABLET | Freq: Once | ORAL | Status: AC
  Administered 2014-07-12: 20 mg via ORAL

## 2014-07-12 MED ORDER — GLYCOPYRROLATE 0.2 MG/ML IJ SOLN
INTRAMUSCULAR | Status: DC | PRN
  Administered 2014-07-12: 0.2 mg via INTRAVENOUS

## 2014-07-12 MED ORDER — NEOSTIGMINE METHYLSULFATE 10 MG/10ML IV SOLN
INTRAVENOUS | Status: DC | PRN
  Administered 2014-07-12: 1 mg via INTRAVENOUS

## 2014-07-12 MED ORDER — ONDANSETRON HCL 4 MG/2ML IJ SOLN: 4.0000 mg | Freq: Once | INTRAMUSCULAR | Status: DC | PRN

## 2014-07-12 SURGICAL SUPPLY — 14 items
CATH ROBINSON RED A/P 16FR (CATHETERS) ×3 IMPLANT
DRSG TELFA 3X8 NADH (GAUZE/BANDAGES/DRESSINGS) ×3 IMPLANT
GLOVE BIO SURGEON STRL SZ8 (GLOVE) ×3 IMPLANT
GOWN STRL REUS W/ TWL LRG LVL3 (GOWN DISPOSABLE) ×1 IMPLANT
GOWN STRL REUS W/ TWL XL LVL3 (GOWN DISPOSABLE) ×1 IMPLANT
GOWN STRL REUS W/TWL LRG LVL3 (GOWN DISPOSABLE) ×2
GOWN STRL REUS W/TWL XL LVL3 (GOWN DISPOSABLE) ×2
KIT RM TURNOVER CYSTO AR (KITS) ×3 IMPLANT
NS IRRIG 1000ML POUR BTL (IV SOLUTION) ×3 IMPLANT
PACK DNC HYST (MISCELLANEOUS) ×3 IMPLANT
PAD OB MATERNITY 4.3X12.25 (PERSONAL CARE ITEMS) ×3 IMPLANT
PAD PREP 24X41 OB/GYN DISP (PERSONAL CARE ITEMS) ×3 IMPLANT
SPONGE XRAY 4X4 16PLY STRL (MISCELLANEOUS) ×3 IMPLANT
TOWEL OR 17X26 4PK STRL BLUE (TOWEL DISPOSABLE) ×3 IMPLANT

## 2014-07-12 NOTE — Anesthesia Preprocedure Evaluation (Addendum)
Anesthesia Evaluation  Patient identified by MRN, date of birth, ID band Patient awake    Reviewed: Allergy & Precautions, NPO status , Patient's Chart, lab work & pertinent test results  History of Anesthesia Complications Negative for: history of anesthetic complications  Airway Mallampati: III  TM Distance: >3 FB Neck ROM: Full    Dental no notable dental hx.    Pulmonary asthma , sleep apnea ,  breath sounds clear to auscultation  Pulmonary exam normal       Cardiovascular Exercise Tolerance: Good negative cardio ROS Normal cardiovascular examRhythm:Regular Rate:Normal     Neuro/Psych negative neurological ROS  negative psych ROS   GI/Hepatic negative GI ROS, Neg liver ROS,   Endo/Other  Hypothyroidism Morbid obesity  Renal/GU negative Renal ROS  negative genitourinary   Musculoskeletal negative musculoskeletal ROS (+)   Abdominal   Peds negative pediatric ROS (+)  Hematology negative hematology ROS (+)   Anesthesia Other Findings   Reproductive/Obstetrics negative OB ROS                             Anesthesia Physical Anesthesia Plan  ASA: III  Anesthesia Plan: General   Post-op Pain Management:    Induction: Intravenous  Airway Management Planned: Oral ETT  Additional Equipment:   Intra-op Plan:   Post-operative Plan: Extubation in OR  Informed Consent: I have reviewed the patients History and Physical, chart, labs and discussed the procedure including the risks, benefits and alternatives for the proposed anesthesia with the patient or authorized representative who has indicated his/her understanding and acceptance.   Dental advisory given  Plan Discussed with: CRNA and Surgeon  Anesthesia Plan Comments:         Anesthesia Quick Evaluation

## 2014-07-12 NOTE — Anesthesia Procedure Notes (Signed)
Procedure Name: Intubation Date/Time: 07/12/2014 5:30 PM Performed by: Irving BurtonBACHICH,  Pre-anesthesia Checklist: Patient identified, Emergency Drugs available, Suction available and Patient being monitored Patient Re-evaluated:Patient Re-evaluated prior to inductionOxygen Delivery Method: Circle system utilized Preoxygenation: Pre-oxygenation with 100% oxygen Intubation Type: IV induction Ventilation: Mask ventilation without difficulty Laryngoscope Size: McGraph and 3 Grade View: Grade I Tube type: Oral Tube size: 7.0 mm Number of attempts: 1 Airway Equipment and Method: Patient positioned with wedge pillow and Stylet Placement Confirmation: ETT inserted through vocal cords under direct vision,  positive ETCO2 and breath sounds checked- equal and bilateral Secured at: 21 cm Tube secured with: Tape Dental Injury: Teeth and Oropharynx as per pre-operative assessment

## 2014-07-12 NOTE — Anesthesia Postprocedure Evaluation (Signed)
  Anesthesia Post-op Note  Patient: Sarah Austin  Procedure(s) Performed: Procedure(s): DILATATION AND CURETTAGE /HYSTEROSCOPY (N/A)  Anesthesia type:General  Patient location: PACU  Post pain: Pain level controlled  Post assessment: Post-op Vital signs reviewed, Patient's Cardiovascular Status Stable, Respiratory Function Stable, Patent Airway and No signs of Nausea or vomiting  Post vital signs: Reviewed and stable  Last Vitals:  Filed Vitals:   07/12/14 1725  BP: 141/87  Pulse: 62  Temp:   Resp: 20    Level of consciousness: awake, alert  and patient cooperative  Complications: No apparent anesthesia complications

## 2014-07-12 NOTE — Discharge Instructions (Signed)
Hysteroscopy Hysteroscopy is a procedure used for looking inside the womb (uterus). It may be done for various reasons, including:  To evaluate abnormal bleeding, fibroid (benign, noncancerous) tumors, polyps, scar tissue (adhesions), and possibly cancer of the uterus.  To look for lumps (tumors) and other uterine growths.  To look for causes of why a woman cannot get pregnant (infertility), causes of recurrent loss of pregnancy (miscarriages), or a lost intrauterine device (IUD).  To perform a sterilization by blocking the fallopian tubes from inside the uterus. In this procedure, a thin, flexible tube with a tiny light and camera on the end of it (hysteroscope) is used to look inside the uterus. A hysteroscopy should be done right after a menstrual period to be sure you are not pregnant. LET [facility] CARE PROVIDER KNOW ABOUT:   Any allergies you have.  All medicines you are taking, including vitamins, herbs, eye drops, creams, and over-the-counter medicines.  Previous problems you or members of your family have had with the use of anesthetics.  Any blood disorders you have.  Previous surgeries you have had.  Medical conditions you have. RISKS AND COMPLICATIONS  Generally, this is a safe procedure. However, as with any procedure, complications can occur. Possible complications include:  Putting a hole in the uterus.  Excessive bleeding.  Infection.  Damage to the cervix.  Injury to other organs.  Allergic reaction to medicines.  Too much fluid used in the uterus for the procedure. BEFORE THE PROCEDURE   Ask your health care provider about changing or stopping any regular medicines.  Do not take aspirin or blood thinners for 1 week before the procedure, or as directed by your health care provider. These can cause bleeding.  If you smoke, do not smoke for 2 weeks before the procedure.  In some cases, a medicine is placed in the cervix the day before the procedure.  This medicine makes the cervix have a larger opening (dilate). This makes it easier for the instrument to be inserted into the uterus during the procedure.  Do not eat or drink anything for at least 8 hours before the surgery.  Arrange for someone to take you home after the procedure. PROCEDURE   You may be given a medicine to relax you (sedative). You may also be given one of the following:  A medicine that numbs the area around the cervix (local anesthetic).  A medicine that makes you sleep through the procedure (general anesthetic).  The hysteroscope is inserted through the vagina into the uterus. The camera on the hysteroscope sends a picture to a TV screen. This gives the surgeon a good view inside the uterus.  During the procedure, air or a liquid is put into the uterus, which allows the surgeon to see better.  Sometimes, tissue is gently scraped from inside the uterus. These tissue samples are sent to a lab for testing. AFTER THE PROCEDURE   If you had a general anesthetic, you may be groggy for a couple hours after the procedure.  If you had a local anesthetic, you will be able to go home as soon as you are stable and feel ready.  You may have some cramping. This normally lasts for a couple days.  You may have bleeding, which varies from light spotting for a few days to menstrual-like bleeding for 3-7 days. This is normal.  If your test results are not back during the visit, make an appointment with your health care provider to find out the  results. Document Released: 03/26/2000 Document Revised: 10/08/2012 Document Reviewed: 07/17/2012 [employer] Patient Information 2015 [employer] [place]. This information is not intended to replace advice given to you by your health care provider. Make sure you discuss any questions you have with your health care provider. AMBULATORY SURGERY  DISCHARGE INSTRUCTIONS   1) The drugs that you were given will stay in your system until tomorrow so  for the next 24 hours you should not:  A) Drive an automobile B) Make any legal decisions C) Drink any alcoholic beverage   2) You may resume regular meals tomorrow.  Today it is better to start with liquids and gradually work up to solid foods.  You may eat anything you prefer, but it is better to start with liquids, then soup and crackers, and gradually work up to solid foods.   3) Please notify your doctor immediately if you have any unusual bleeding, trouble breathing, redness and pain at the surgery site, drainage, fever, or pain not relieved by medication. 4)   5) Your post-operative visit with Dr.                                     is: Date:                        Time:    Please call to schedule your post-operative visit.  6) Additional Instructions: 7)

## 2014-07-12 NOTE — Interval H&P Note (Signed)
History and Physical Interval Note:  07/12/2014 3:56 PM  Sarah Austin  has presented today for surgery, with the diagnosis of POSTMENOPAUSAL BLEEDING  The various methods of treatment have been discussed with the patient and family. After consideration of risks, benefits and other options for treatment, the patient has consented to  Procedure(s): DILATATION & CURETTAGE/HYSTEROSCOPY WITH NOVASURE ABLATION (N/A) as a surgical intervention .  The patient's history has been reviewed, patient examined, no change in status, stable for surgery.  I have reviewed the patient's chart and labs.  Questions were answered to the patient's satisfaction.     Daphine DeutscherMartin A 

## 2014-07-12 NOTE — Op Note (Signed)
OPERATIVE NOTE  Date of surgery: 07/12/2014  Preoperative diagnosis: Postmenopausal bleeding; endometrial polyps Postoperative diagnosis: Postmenopausal Operative procedure: 1. Hysteroscopy 2. Dilation and curettage of endometrium  Surgeon: Dr. Beatris Sie Francesco Assistant: None Anesthesia: Gen. endotracheal    Findings: Midplane uterus Pelvis: Gynecoid Uterus: sounded 9 cm Normal appearing endometrial cavity without gross lesions.  Description of procedure Patient was brought to the operating room where she was placed in supine position. General anesthesia was induced without difficulty using the LMA technique. The patient was placed in dorsal lithotomy position using candy cane stirrups. A betadine perineal intravaginal prep and drape was performed in standard fashion. Weighted speculum was placed in the vagina. Single-tooth tenaculum was placed on the anterior cervix. Uterus was sounded to 9 cm. Hanks dilators were used up to a #20 [place] caliber to dilate the endocervical canal. The [facility] hysteroscope using lactated Ringer's as irrigant was used for the hysteroscopy. The above-noted findings were photo documented. The hysteroscope removed. The smooth and serrated curettes were then used to curettage the endometrial cavity with production of average tissue. Stone polyp forceps were used to grasp residual tissue left behind. Repeat hysteroscopy was performed and demonstrated excellent sampling. Procedure was then terminated with all instrumentation being removed from the vagina. The patient was then awakened mobilized and taken to the recovery room in satisfactory condition.  IV fluids:   mL. Urine output: 15 mL. EBL: <10 mL.  Instruments, needles, and sponge counts were verified as correct.  Herold HarmsMartin A , MD 07/12/2014

## 2014-07-12 NOTE — Transfer of Care (Signed)
Immediate Anesthesia Transfer of Care Note  Patient: Sarah Austin  Procedure(s) Performed: Procedure(s): DILATATION AND CURETTAGE /HYSTEROSCOPY (N/A)  Patient Location: PACU  Anesthesia Type:General  Level of Consciousness: awake, alert  and oriented  Airway & Oxygen Therapy: Patient Spontanous Breathing and Patient connected to face mask oxygen  Post-op Assessment: Report given to RN and Post -op Vital signs reviewed and stable  Post vital signs: stable  Last Vitals:  Filed Vitals:   07/12/14 1709  BP: 149/94  Pulse: 71  Temp: 37 C  Resp: 17    Complications: No apparent anesthesia complications

## 2014-07-12 NOTE — H&P (View-Only) (Signed)
Patient ID: EWA HIPP, female   DOB: 10-25-1965, 49 y.o.   MRN: [id number]   Pre-op - hysteroscopy and d&c- 07/12/2014 d/t PMB  Preoperative history and physical   Date of surgery: 07/12/2014 Chief complaint: 1.  Postmenopausal bleeding. 2.  Endometrial polyps on endometrial biopsy   Patient is a 49 y.o. No obstetric history on file.female scheduled for hysteroscopy/D&C. Indications for procedure are postmenopausal bleeding and endometrial polyps on endometrial biopsy.  The patient is a 49 year old white divorced female, para 61, who presents in referral from Dr. Luella Cook for evaluation and management of postmenopausal bleeding. Patient had episode of heavy bleeding with clots and cramps following an 8 month period of amenorrhea. FSH level was 78, consistent with menopause. Endometrial biopsy demonstrated fragments of endometrial polyp. Patient was advised to proceed with hysteroscopy/D&C. Patient has family history of endometrial cancer in mother. No history of colon, ovary or breast cancer in her family.   Pertinent Gynecological History: Per HPI  Discussed Blood/Blood Products: no   Menstrual History: OB History    No data available      Menarche age: unknown Patient's last menstrual period was 07/01/2013.    Past Medical History  Diagnosis Date  . Thyroid disease   . Asthma   . Obesity   . Allergy   . Sleep apnea     Past Surgical History  Procedure Laterality Date  . Wisdom tooth extraction    . Urethra surgery      1969  . Endometrial biopsy  05/19/2014    at [facility]- benign endometrial polyps    OB History  No data available    History   Social History  . Marital Status: Divorced    Spouse Name: N/A  . Number of Children: N/A  . Years of Education: N/A   Social History Main Topics  . Smoking status: Never Smoker   . Smokeless tobacco: Not on file  . Alcohol Use: No  . Drug Use: No  . Sexual Activity: No   Other Topics Concern  . None    Social History Narrative    Family History  Problem Relation Age of Onset  . Arthritis Mother   . Cancer Mother   . Hypertension Mother   . Diabetes Mother   . Uterine cancer Mother   . Heart disease Father   . Heart disease Sister   . Cancer Maternal Grandmother   . Breast cancer Neg Hx   . Ovarian cancer Neg Hx   . Colon cancer Neg Hx      (Not in a hospital admission)  No Known Allergies  Review of Systems Constitutional: No recent fever/chills/sweats Respiratory: No recent cough/bronchitis Cardiovascular: No chest pain Gastrointestinal: No recent nausea/vomiting/diarrhea Genitourinary: No UTI symptoms Hematologic/lymphatic:No history of coagulopathy or recent blood thinner use    Objective:    BP 118/78 mmHg  Pulse 65  Ht  (1.753 m)  Wt 312 lb 6.4 oz (141.704 kg)  BMI 46.11 kg/m2  LMP 07/01/2013  General:   Normal  Skin:   normal  HEENT:  Normal  Neck:  Supple without Adenopathy or Thyromegaly  Lungs:   Heart:              Breasts:   Abdomen:  Pelvis:  M/S   Extremeties:  Neuro:    clear to auscultation bilaterally   Normal without murmur   Not Examined   soft, non-tender; bowel sounds normal; no masses,  no organomegaly   External genitalia-normal.  BUS-normal. Vagina-normal. Cervix-no gross lesions; light bloody discharge present. Uterus-midline, normal size and shape, nontender. Adnexa-nonpalpable and nontender  No CVAT  Warm/Dry   Normal          Assessment:      1. Postmenopausal bleeding 2.  Endometrial polyps on endometrial biopsy   Plan:  1.  Hysteroscopy/D&C.  Preoperative counseling: The patient has been counseled regarding the planned procedure.  She is understanding of the planned procedure and is aware of and is accepting of all surgical risks which include but are not limited to bleeding, infection, pelvic organ injury, need for repair, uterine perforation, anesthesia risks, etc.  All questions have been answered.   Informed consent is given.  Patient is ready and willing to proceed with surgery as scheduled.

## 2014-07-13 ENCOUNTER — Encounter: Payer: Self-pay | Admitting: Obstetrics and Gynecology

## 2014-07-14 ENCOUNTER — Telehealth: Payer: Self-pay | Admitting: Obstetrics and Gynecology

## 2014-07-14 NOTE — Telephone Encounter (Signed)
Patient had surgery this past Monday and needs a note stating she can stay out of work until Monday. She is still very sore. Thanks

## 2014-07-14 NOTE — Telephone Encounter (Signed)
Per mad ok  To give pt note. Pts sister stopped by office and work note given to her.

## 2014-07-15 LAB — SURGICAL PATHOLOGY

## 2014-07-20 ENCOUNTER — Encounter: Payer: Self-pay | Admitting: Obstetrics and Gynecology

## 2014-07-20 ENCOUNTER — Ambulatory Visit (INDEPENDENT_AMBULATORY_CARE_PROVIDER_SITE_OTHER): Payer: 59 | Admitting: Obstetrics and Gynecology

## 2014-07-20 VITALS — BP 110/73 | HR 63 | Ht 69.0 in | Wt 309.8 lb

## 2014-07-20 DIAGNOSIS — N950 Postmenopausal bleeding: Secondary | ICD-10-CM

## 2014-07-20 DIAGNOSIS — Z09 Encounter for follow-up examination after completed treatment for conditions other than malignant neoplasm: Secondary | ICD-10-CM

## 2014-07-20 NOTE — Progress Notes (Signed)
Patient ID: Sarah Austin, female   DOB: 03/13/1965, 49 y.o.   MRN: [id number]   POSTOPERATIVE OP CHECK Patient is 1 week status post hysteroscopy/D&C for evaluation of postmenopausal bleeding and possibility of polyps. Patient has done well since surgery with normal bowel and bladder function. No significant bleeding or vaginal discharge is encountered. No significant pain is encountered.  Pathology: Benign endometrium without evidence of hyperplasia or carcinoma  Findings reviewed with patient. Multiple questions answered. ASSESSMENT: Normal 1 week postop check status post hysteroscopy D&C  PLAN: 1. Menstrual calendar monitoring 2. Return in 6 months for follow-up  Addendum: (Patient was referred by Dr. Luella Cookosenow for surgery. I have recommended that she return to see him; however, she requests return here for evaluation).

## 2015-01-20 ENCOUNTER — Ambulatory Visit: Payer: 59 | Admitting: Obstetrics and Gynecology

## 2015-02-22 ENCOUNTER — Ambulatory Visit: Payer: 59 | Admitting: Obstetrics and Gynecology

## 2017-04-17 IMAGING — CR DG CHEST 1V
1 series · 1 of 1 positions shown · non-contrast
Comparison: Chest x-ray of 02/23/2012

CLINICAL DATA: Preop for D and C

EXAM:
CHEST  1 VIEW

[chest pa]
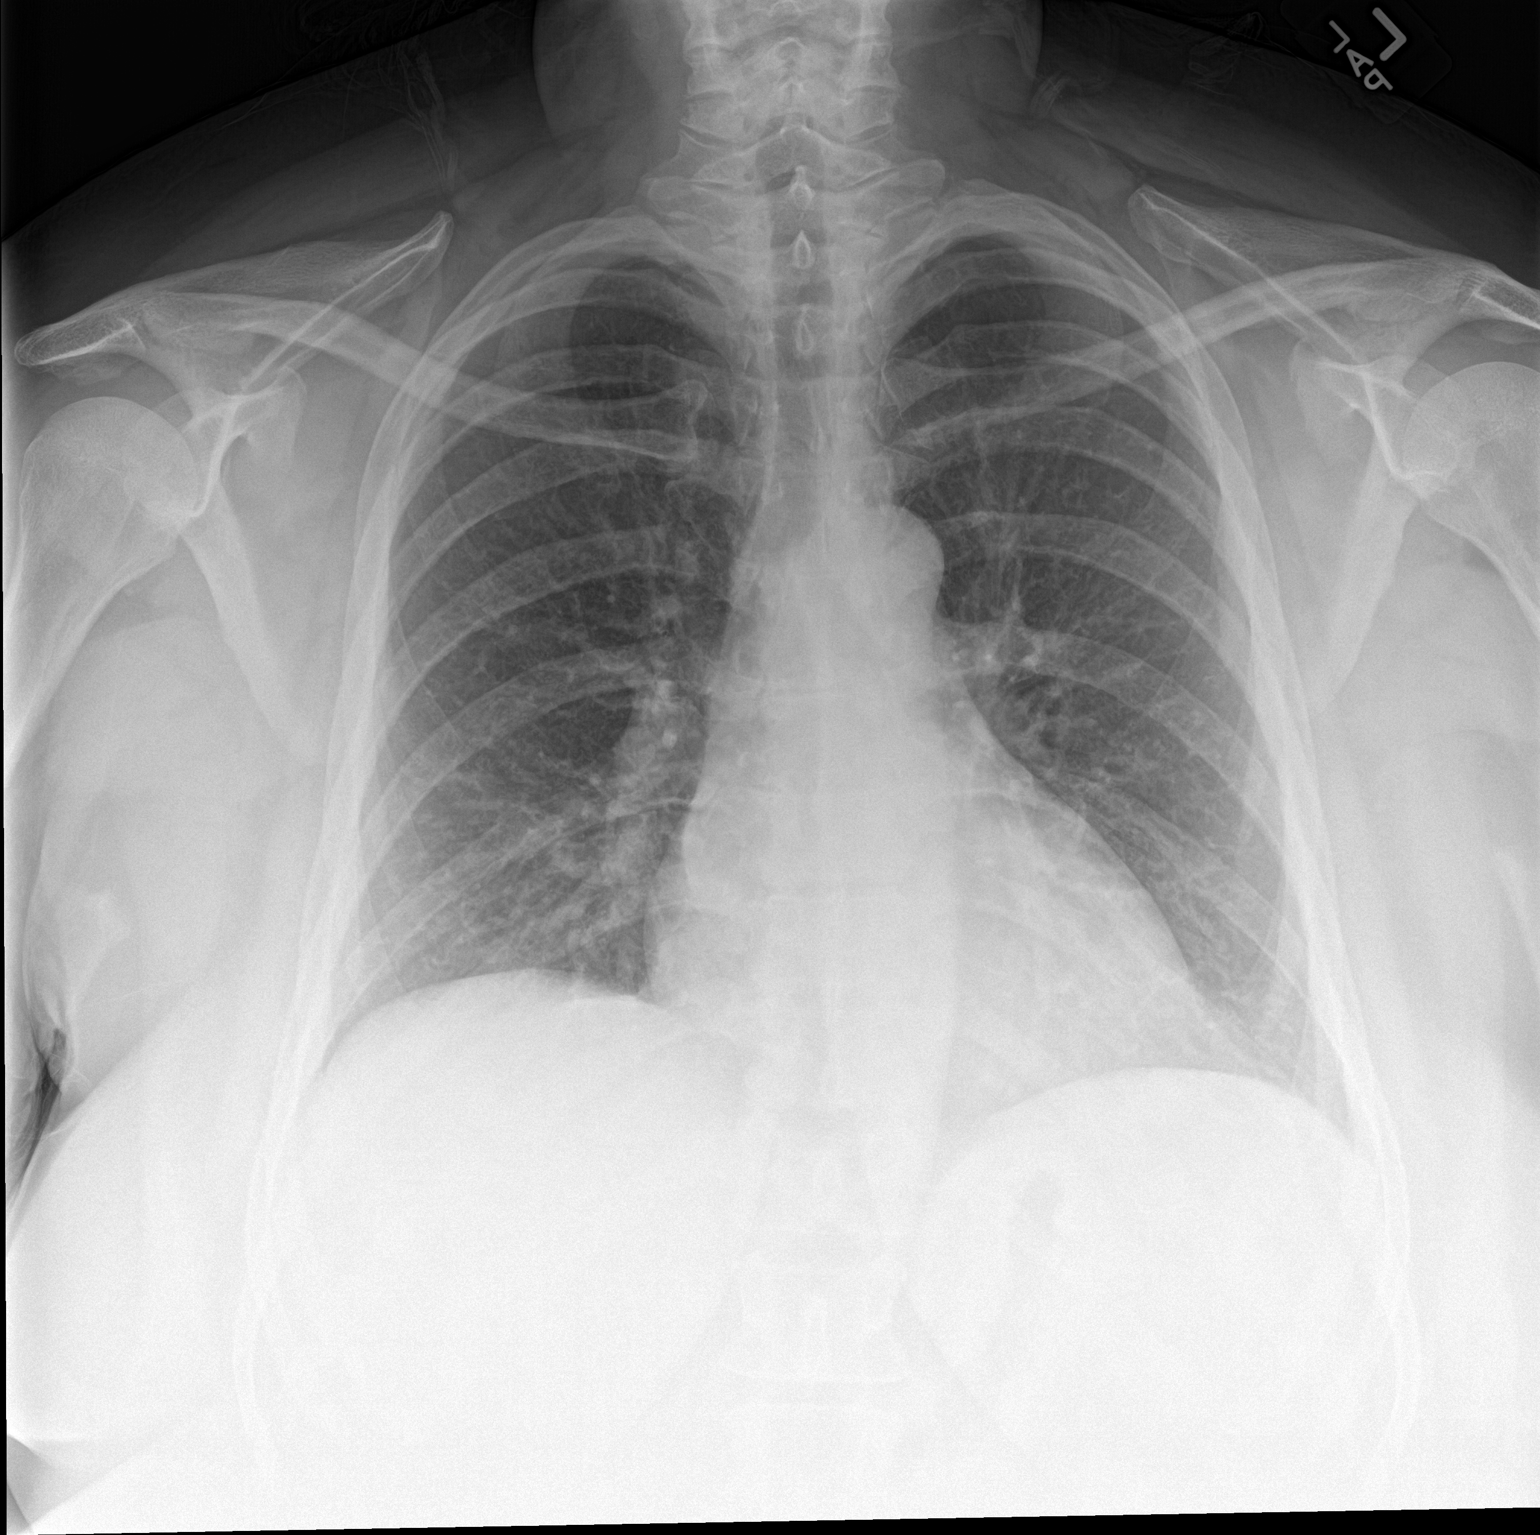

[1 of 1 positions shown; findings below may reference images not displayed]

FINDINGS: No active infiltrate or effusion is seen. Mediastinal and hilar
contours are unremarkable. The heart is within normal limits in
size. No bony abnormality is seen.
IMPRESSION: No active disease.

## 2017-12-03 ENCOUNTER — Telehealth: Payer: Self-pay | Admitting: *Deleted

## 2017-12-03 NOTE — Telephone Encounter (Signed)
Copied from CRM [id number]. Topic: Appointment Scheduling - New Patient >> Dec 03, 2017  9:19 AM Louie BunPalacios Medina, Rosey Batheresa D wrote: New patient has been scheduled for your office. Provider: Dr. Shirlee LatchMcLean Date of Appointment: 01-09-17  Route to department's PEC pool.

## 2018-01-09 ENCOUNTER — Ambulatory Visit: Payer: 59 | Admitting: Internal Medicine

## 2019-01-01 ENCOUNTER — Ambulatory Visit (INDEPENDENT_AMBULATORY_CARE_PROVIDER_SITE_OTHER): Payer: 59 | Admitting: Family Medicine

## 2019-01-01 ENCOUNTER — Other Ambulatory Visit: Payer: Self-pay

## 2019-01-01 ENCOUNTER — Encounter: Payer: Self-pay | Admitting: Family Medicine

## 2019-01-01 VITALS — BP 128/86 | HR 95 | Temp 97.3°F | Resp 18 | Ht 69.25 in | Wt 313.2 lb

## 2019-01-01 DIAGNOSIS — E538 Deficiency of other specified B group vitamins: Secondary | ICD-10-CM | POA: Diagnosis not present

## 2019-01-01 DIAGNOSIS — E039 Hypothyroidism, unspecified: Secondary | ICD-10-CM | POA: Diagnosis not present

## 2019-01-01 DIAGNOSIS — Z13220 Encounter for screening for lipoid disorders: Secondary | ICD-10-CM

## 2019-01-01 DIAGNOSIS — Z131 Encounter for screening for diabetes mellitus: Secondary | ICD-10-CM

## 2019-01-01 DIAGNOSIS — J4540 Moderate persistent asthma, uncomplicated: Secondary | ICD-10-CM

## 2019-01-01 DIAGNOSIS — E559 Vitamin D deficiency, unspecified: Secondary | ICD-10-CM

## 2019-01-01 DIAGNOSIS — Z1231 Encounter for screening mammogram for malignant neoplasm of breast: Secondary | ICD-10-CM

## 2019-01-01 MED ORDER — BECLOMETHASONE DIPROPIONATE 80 MCG/ACT IN AERS: 2.0000 | INHALATION_SPRAY | Freq: Two times a day (BID) | RESPIRATORY_TRACT | 11 refills | Status: DC

## 2019-01-01 MED ORDER — ALBUTEROL SULFATE HFA 108 (90 BASE) MCG/ACT IN AERS: 2.0000 | INHALATION_SPRAY | Freq: Four times a day (QID) | RESPIRATORY_TRACT | 3 refills | Status: DC | PRN

## 2019-01-01 MED ORDER — LEVOTHYROXINE SODIUM 150 MCG PO TABS: 150.0000 ug | ORAL_TABLET | Freq: Every day | ORAL | 3 refills | Status: DC

## 2019-01-01 NOTE — Progress Notes (Signed)
Subjective:     Sarah Austin is a 53 y.o. female presenting for Establish Care (previous PCP Dr Jerrol Banana 2016), Medication Refill, and Discuss labs ([facility])     HPI   #Hypothyroidism - ran out of synthroid - 2 months ago - has noticed worsening fatigue  #B12 - was on b12 shots - last time was 1 year ago  #asthma - is using albuterol regularly  - hx of bronchitis transitioning to pneumonia - also has a nebulizer - using albuterol 2-3 times a week - has a clean air machine  #obesity - doing slim fast and light dinner - trying to exercise more as well  #PAP - follows with OB/GYN   Review of Systems  Constitutional: Positive for fatigue.  Respiratory: Negative for cough, shortness of breath and wheezing.   Endocrine: Negative for cold intolerance and heat intolerance.     Social History   Tobacco Use  Smoking Status Never Smoker  Smokeless Tobacco Never Used        Objective:    BP Readings from Last 3 Encounters:  01/01/19 128/86  07/20/14 110/73  07/12/14 112/66   Wt Readings from Last 3 Encounters:  01/01/19 (!) 313 lb 4 oz (142.1 kg)  07/20/14 (!) 309 lb 12.8 oz (140.5 kg)  07/12/14 (!) 315 lb (142.9 kg)    BP 128/86   Pulse 95   Temp (!) 97.3 F (36.3 C)   Resp 18   Ht 5' 9.25" (1.759 m)   Wt (!) 313 lb 4 oz (142.1 kg)   LMP 04/02/2014   SpO2 96%   BMI 45.93 kg/m    Physical Exam Constitutional:      General: She is not in acute distress.    Appearance: She is well-developed. She is obese. She is not diaphoretic.  HENT:     Right Ear: External ear normal.     Left Ear: External ear normal.     Nose:     Comments: Wearing mask Eyes:     Conjunctiva/sclera: Conjunctivae normal.  Neck:     Thyroid: No thyromegaly or thyroid tenderness.  Cardiovascular:     Rate and Rhythm: Normal rate and regular rhythm.     Heart sounds: No murmur.  Pulmonary:     Effort: Pulmonary effort is normal. No respiratory distress.   Breath sounds: Normal breath sounds. No wheezing.  Musculoskeletal:     Cervical back: Neck supple.  Skin:    General: Skin is warm and dry.     Capillary Refill: Capillary refill takes less than 2 seconds.  Neurological:     Mental Status: She is alert. Mental status is at baseline.  Psychiatric:        Mood and Affect: Mood normal.        Behavior: Behavior normal.           Assessment & Plan:   Problem List Items Addressed This Visit      Respiratory   Asthma, chronic    Using albuterol 2-3 times a week. Discussed adding Qvar to see if that reduces regular symptoms.       Relevant Medications   beclomethasone (QVAR) 80 MCG/ACT inhaler   albuterol (VENTOLIN HFA) 108 (90 Base) MCG/ACT inhaler     Endocrine   Hypothyroidism - Primary    Off medication x 2-3 months. Suspect this is the cause of her fatigue. Restart medication. Return in 6 weeks for TSH check.       Relevant  Medications   levothyroxine (SYNTHROID) 150 MCG tablet     Other   Morbid obesity (HCC)    Screening labs for risk factors today. Pt already working on weight loss and exercise. Continued to encourage      Relevant Orders   Comprehensive metabolic panel   Lipid Profile   Hemoglobin A1c   Vitamin D deficiency   Relevant Orders   Vitamin D, 25-hydroxy   Vitamin B12 deficiency    Was on B12 shots >1 year ago. Will repeat labs and resume shots if low      Relevant Orders   B12   CBC    Other Visit Diagnoses    Screening for hyperlipidemia       Relevant Orders   Lipid Profile   Screening for diabetes mellitus       Relevant Orders   Hemoglobin A1c   Encounter for screening mammogram for malignant neoplasm of breast       Relevant Orders   MM Digital Screening       Return in about 6 weeks (around 02/12/2019) for labs.  Lynnda Child, MD

## 2019-01-01 NOTE — Patient Instructions (Signed)
#   Asthma - start daily steroid inhaler - hopefully this will reduce the amount of times you need albuterol  #Fatigue - restart thyroid medication - check labs today - return in 6 weeks to repeat labs - you can make a lab appointment

## 2019-01-01 NOTE — Assessment & Plan Note (Signed)
Off medication x 2-3 months. Suspect this is the cause of her fatigue. Restart medication. Return in 6 weeks for TSH check.

## 2019-01-01 NOTE — Assessment & Plan Note (Signed)
Using albuterol 2-3 times a week. Discussed adding Qvar to see if that reduces regular symptoms.

## 2019-01-01 NOTE — Assessment & Plan Note (Signed)
Screening labs for risk factors today. Pt already working on weight loss and exercise. Continued to encourage

## 2019-01-01 NOTE — Assessment & Plan Note (Signed)
Was on B12 shots >1 year ago. Will repeat labs and resume shots if low

## 2019-01-02 LAB — LIPID PANEL
Chol/HDL Ratio: 4.6 ratio — ABNORMAL HIGH (ref 0.0–4.4)
Cholesterol, Total: 225 mg/dL — ABNORMAL HIGH (ref 100–199)
HDL: 49 mg/dL (ref >39)
LDL Chol Calc (NIH): 152 mg/dL — ABNORMAL HIGH (ref 0–99)
Triglycerides: 131 mg/dL (ref 0–149)
VLDL Cholesterol Cal: 24 mg/dL (ref 5–40)

## 2019-01-02 LAB — COMPREHENSIVE METABOLIC PANEL
ALT: 15 IU/L (ref 0–32)
AST: 17 IU/L (ref 0–40)
Albumin/Globulin Ratio: 1.4 (ref 1.2–2.2)
Albumin: 4.2 g/dL (ref 3.8–4.9)
Alkaline Phosphatase: 87 IU/L (ref 39–117)
BUN/Creatinine Ratio: 15 (ref 9–23)
BUN: 16 mg/dL (ref 6–24)
Bilirubin Total: 0.6 mg/dL (ref 0.0–1.2)
CO2: 23 mmol/L (ref 20–29)
Calcium: 8.8 mg/dL (ref 8.7–10.2)
Chloride: 106 mmol/L (ref 96–106)
Creatinine, Ser: 1.1 mg/dL — ABNORMAL HIGH (ref 0.57–1.00)
GFR calc Af Amer: 66 mL/min/{1.73_m2} (ref >59)
GFR calc non Af Amer: 57 mL/min/{1.73_m2} — ABNORMAL LOW (ref >59)
Globulin, Total: 2.9 g/dL (ref 1.5–4.5)
Glucose: 102 mg/dL — ABNORMAL HIGH (ref 65–99)
Potassium: 4.1 mmol/L (ref 3.5–5.2)
Sodium: 142 mmol/L (ref 134–144)
Total Protein: 7.1 g/dL (ref 6.0–8.5)

## 2019-01-02 LAB — CBC
Hematocrit: 38.4 % (ref 34.0–46.6)
Hemoglobin: 13.2 g/dL (ref 11.1–15.9)
MCH: 29.7 pg (ref 26.6–33.0)
MCHC: 34.4 g/dL (ref 31.5–35.7)
MCV: 86 fL (ref 79–97)
Platelets: 163 10*3/uL (ref 150–450)
RBC: 4.45 x10E6/uL (ref 3.77–5.28)
RDW: 13.2 % (ref 11.7–15.4)
WBC: 6.5 10*3/uL (ref 3.4–10.8)

## 2019-01-02 LAB — VITAMIN B12: Vitamin B-12: 270 pg/mL (ref 232–1245)

## 2019-01-02 LAB — HEMOGLOBIN A1C
Est. average glucose Bld gHb Est-mCnc: 103 mg/dL
Hgb A1c MFr Bld: 5.2 % (ref 4.8–5.6)

## 2019-01-02 LAB — VITAMIN D 25 HYDROXY (VIT D DEFICIENCY, FRACTURES): Vit D, 25-Hydroxy: 26.8 ng/mL — ABNORMAL LOW (ref 30.0–100.0)

## 2019-01-05 ENCOUNTER — Telehealth: Payer: Self-pay | Admitting: Family Medicine

## 2019-01-05 DIAGNOSIS — N1831 Chronic kidney disease, stage 3a: Secondary | ICD-10-CM | POA: Insufficient documentation

## 2019-01-05 DIAGNOSIS — E559 Vitamin D deficiency, unspecified: Secondary | ICD-10-CM

## 2019-01-05 DIAGNOSIS — E039 Hypothyroidism, unspecified: Secondary | ICD-10-CM

## 2019-01-05 MED ORDER — VITAMIN D (ERGOCALCIFEROL) 1.25 MG (50000 UNIT) PO CAPS
50000.0000 [IU] | ORAL_CAPSULE | ORAL | 1 refills | Status: DC
Start: 2019-01-05 — End: 2022-05-17

## 2019-01-05 NOTE — Telephone Encounter (Signed)
Called home number and it rang busy, called mobile number and left a message to call back

## 2019-01-05 NOTE — Telephone Encounter (Signed)
Sarah Austin,[facility],called.  She said they don't have the records requested.

## 2019-01-05 NOTE — Telephone Encounter (Signed)
Noted thank you

## 2019-01-05 NOTE — Telephone Encounter (Signed)
Please call patient to relay lab results.    Her kidney function is a little decreased from the normal levels. We can repeat this in ~6 weeks when she returns for thyroid function testing.   Her B12 level was normal. No need for shots.   Her electrolytes and liver function were normal.   Blood counts were normal  Vitamin D was a little low - as we discussed, would recommend high dose replacement for 8 weeks followed by daily vitamin.   No diabetes

## 2019-01-07 NOTE — Telephone Encounter (Signed)
Left message for patient to call back  

## 2019-01-07 NOTE — Telephone Encounter (Signed)
Patient advised of everything. Patient states she will call back for lab appointment

## 2019-02-08 ENCOUNTER — Other Ambulatory Visit: Payer: Self-pay | Admitting: Family Medicine

## 2019-02-08 DIAGNOSIS — E559 Vitamin D deficiency, unspecified: Secondary | ICD-10-CM

## 2019-03-15 ENCOUNTER — Other Ambulatory Visit: Payer: Self-pay | Admitting: Family Medicine

## 2019-03-15 DIAGNOSIS — E559 Vitamin D deficiency, unspecified: Secondary | ICD-10-CM

## 2019-03-16 NOTE — Telephone Encounter (Signed)
Patient is due for lab appointment only. Should be done with Vitamin D RX, should be on OTC Vitamin D now.  Called both numbers in the chart and left a message to call back

## 2019-03-18 NOTE — Telephone Encounter (Signed)
Called patient again but could not leave a message

## 2019-03-18 NOTE — Telephone Encounter (Signed)
Mailed letter to patient asking to call to schedule lab only appointment

## 2019-07-16 ENCOUNTER — Telehealth: Payer: Self-pay

## 2019-07-16 ENCOUNTER — Other Ambulatory Visit: Payer: Self-pay | Admitting: Family Medicine

## 2019-07-16 DIAGNOSIS — E559 Vitamin D deficiency, unspecified: Secondary | ICD-10-CM

## 2019-07-16 NOTE — Telephone Encounter (Signed)
Last office visit 01/01/2019 for establish care.  Last refilled 01/05/2019 for 4 tablets with 1 refill.  Last Vit D level 01/01/2019 which was low at 26.8 ng/ml.  Last refill request from 03/15/2019 was denied and a letter mailed to patient to call and schedule lab only appointment.  No future appointments.

## 2019-07-16 NOTE — Telephone Encounter (Signed)
No need for additional high dose replacement after 1 course.   Recommend daily supplement of [phone] units Vit D daily.

## 2019-07-16 NOTE — Telephone Encounter (Signed)
I left a VM (DPR) for Pt, relaying Dr. Elmyra Ricks information about no longer needing the high dose of Vitamin D and to just use an OTC supplement.

## 2019-08-04 ENCOUNTER — Other Ambulatory Visit: Payer: Self-pay

## 2019-08-04 ENCOUNTER — Other Ambulatory Visit (INDEPENDENT_AMBULATORY_CARE_PROVIDER_SITE_OTHER): Payer: 59

## 2019-08-04 ENCOUNTER — Ambulatory Visit (INDEPENDENT_AMBULATORY_CARE_PROVIDER_SITE_OTHER): Payer: 59

## 2019-08-04 DIAGNOSIS — Z23 Encounter for immunization: Secondary | ICD-10-CM | POA: Diagnosis not present

## 2019-08-04 DIAGNOSIS — E039 Hypothyroidism, unspecified: Secondary | ICD-10-CM | POA: Diagnosis not present

## 2019-08-04 DIAGNOSIS — N1831 Chronic kidney disease, stage 3a: Secondary | ICD-10-CM

## 2019-08-04 NOTE — Progress Notes (Signed)
Pt here for first shingles vaccine. Gave vaccine in R deltoid. Pt tolerated well. Scheduled pt for #2 shingles vaccine.

## 2019-08-04 NOTE — Addendum Note (Signed)
Addended by: Alvina Chou on: 08/04/2019 08:46 AM   Modules accepted: Orders

## 2019-08-05 LAB — BASIC METABOLIC PANEL
BUN/Creatinine Ratio: 11 (ref 9–23)
BUN: 12 mg/dL (ref 6–24)
CO2: 25 mmol/L (ref 20–29)
Calcium: 8.7 mg/dL (ref 8.7–10.2)
Chloride: 106 mmol/L (ref 96–106)
Creatinine, Ser: 1.08 mg/dL — ABNORMAL HIGH (ref 0.57–1.00)
GFR calc Af Amer: 67 mL/min/{1.73_m2} (ref >59)
GFR calc non Af Amer: 58 mL/min/{1.73_m2} — ABNORMAL LOW (ref >59)
Glucose: 108 mg/dL — ABNORMAL HIGH (ref 65–99)
Potassium: 3.6 mmol/L (ref 3.5–5.2)
Sodium: 144 mmol/L (ref 134–144)

## 2019-08-05 LAB — TSH: TSH: 1.82 u[IU]/mL (ref 0.450–4.500)

## 2019-10-22 ENCOUNTER — Other Ambulatory Visit: Payer: Self-pay

## 2019-10-22 ENCOUNTER — Ambulatory Visit (INDEPENDENT_AMBULATORY_CARE_PROVIDER_SITE_OTHER): Payer: 59

## 2019-10-22 DIAGNOSIS — Z23 Encounter for immunization: Secondary | ICD-10-CM

## 2019-11-23 ENCOUNTER — Ambulatory Visit: Payer: 59 | Admitting: Family Medicine

## 2019-11-25 ENCOUNTER — Ambulatory Visit: Payer: 59 | Admitting: Family Medicine

## 2019-11-25 ENCOUNTER — Other Ambulatory Visit: Payer: Self-pay

## 2019-11-25 VITALS — BP 126/78 | HR 85 | Temp 97.4°F | Ht 69.0 in | Wt 313.5 lb

## 2019-11-25 DIAGNOSIS — E039 Hypothyroidism, unspecified: Secondary | ICD-10-CM | POA: Diagnosis not present

## 2019-11-25 DIAGNOSIS — J4540 Moderate persistent asthma, uncomplicated: Secondary | ICD-10-CM | POA: Diagnosis not present

## 2019-11-25 DIAGNOSIS — E559 Vitamin D deficiency, unspecified: Secondary | ICD-10-CM

## 2019-11-25 DIAGNOSIS — N1831 Chronic kidney disease, stage 3a: Secondary | ICD-10-CM

## 2019-11-25 DIAGNOSIS — Z23 Encounter for immunization: Secondary | ICD-10-CM

## 2019-11-25 DIAGNOSIS — R319 Hematuria, unspecified: Secondary | ICD-10-CM

## 2019-11-25 DIAGNOSIS — L309 Dermatitis, unspecified: Secondary | ICD-10-CM

## 2019-11-25 DIAGNOSIS — N3000 Acute cystitis without hematuria: Secondary | ICD-10-CM | POA: Diagnosis not present

## 2019-11-25 DIAGNOSIS — E782 Mixed hyperlipidemia: Secondary | ICD-10-CM

## 2019-11-25 LAB — POC URINALSYSI DIPSTICK (AUTOMATED)
Bilirubin, UA: NEGATIVE
Blood, UA: NEGATIVE
Glucose, UA: NEGATIVE
Ketones, UA: NEGATIVE
Leukocytes, UA: NEGATIVE
Nitrite, UA: POSITIVE
Protein, UA: POSITIVE — AB
Spec Grav, UA: >=1.03 — AB (ref 1.010–1.025)
Urobilinogen, UA: 0.2 E.U./dL
pH, UA: 6 (ref 5.0–8.0)

## 2019-11-25 MED ORDER — ALBUTEROL SULFATE HFA 108 (90 BASE) MCG/ACT IN AERS: 2.0000 | INHALATION_SPRAY | Freq: Four times a day (QID) | RESPIRATORY_TRACT | 3 refills | Status: DC | PRN

## 2019-11-25 MED ORDER — TRIAMCINOLONE ACETONIDE 0.5 % EX OINT
1.0000 | TOPICAL_OINTMENT | Freq: Two times a day (BID) | CUTANEOUS | 0 refills | Status: DC
Start: 2019-11-25 — End: 2022-05-17

## 2019-11-25 MED ORDER — ALBUTEROL SULFATE 0.63 MG/3ML IN NEBU: 1.0000 | INHALATION_SOLUTION | Freq: Four times a day (QID) | RESPIRATORY_TRACT | 1 refills | Status: DC | PRN

## 2019-11-25 MED ORDER — NITROFURANTOIN MONOHYD MACRO 100 MG PO CAPS: 100.0000 mg | ORAL_CAPSULE | Freq: Two times a day (BID) | ORAL | 0 refills | Status: AC

## 2019-11-25 MED ORDER — BECLOMETHASONE DIPROPIONATE 80 MCG/ACT IN AERS: 2.0000 | INHALATION_SPRAY | Freq: Two times a day (BID) | RESPIRATORY_TRACT | 11 refills | Status: DC

## 2019-11-25 MED ORDER — LEVOTHYROXINE SODIUM 150 MCG PO TABS: 150.0000 ug | ORAL_TABLET | Freq: Every day | ORAL | 3 refills | Status: DC

## 2019-11-25 NOTE — Patient Instructions (Signed)
Urine infection  - take the antibiotic - macrobid for 5 days - if no improvement - return next week  Skin rash - steroid ointment to the pharmacy  Labs today

## 2019-11-25 NOTE — Assessment & Plan Note (Signed)
Stable. Cont qvar. Cont prn albuterol. PPSV-23 today due to lung disease

## 2019-11-25 NOTE — Assessment & Plan Note (Signed)
Rash improved but not resolved with hydrocortisone so will start triamcinolone. Call if not improving.

## 2019-11-25 NOTE — Assessment & Plan Note (Signed)
Has not been taking daily supplement. Will repeat due to fatigue. If low replace and then take daily supplement of 1000 units daily

## 2019-11-25 NOTE — Progress Notes (Signed)
Subjective:     Sarah Austin is a 54 y.o. female presenting for Labs Only, Hematuria (x 5 days ), Urinary Frequency (x 5 days ), and Rash (R calf )     HPI  #Urinary symptoms - blood in the urine - frequency - light pink - no burning - some urgency afterwards - thinks it is UTI  #Rash - mowing in a ditch - stared 2 months ago - itchy - hydrocortisone cream, neosporin w/ some improvement - has been scratching it in her sleep   Asthma doing well with controller, rare use of albuterol  Thyroid - has been fatigued, wants to make sure this dose is good  Review of Systems   Social History   Tobacco Use  Smoking Status Never Smoker  Smokeless Tobacco Never Used        Objective:    BP Readings from Last 3 Encounters:  11/25/19 126/78  01/01/19 128/86  07/20/14 110/73   Wt Readings from Last 3 Encounters:  11/25/19 (!) 313 lb 8 oz (142.2 kg)  01/01/19 (!) 313 lb 4 oz (142.1 kg)  07/20/14 (!) 309 lb 12.8 oz (140.5 kg)    BP 126/78   Pulse 85   Temp (!) 97.4 F (36.3 C) (Temporal)   Ht 5\' 9"  (1.753 m)   Wt (!) 313 lb 8 oz (142.2 kg)   LMP 04/02/2014   SpO2 96%   BMI 46.30 kg/m    Physical Exam Constitutional:      General: She is not in acute distress.    Appearance: She is well-developed. She is obese. She is not diaphoretic.  HENT:     Right Ear: External ear normal.     Left Ear: External ear normal.  Eyes:     Conjunctiva/sclera: Conjunctivae normal.  Cardiovascular:     Rate and Rhythm: Normal rate and regular rhythm.     Heart sounds: No murmur heard.   Pulmonary:     Effort: Pulmonary effort is normal. No respiratory distress.     Breath sounds: Normal breath sounds. No wheezing.  Musculoskeletal:     Cervical back: Neck supple.  Skin:    General: Skin is warm and dry.     Capillary Refill: Capillary refill takes less than 2 seconds.     Comments: Scaly red to purple plaques on the right calf with some erythema and  excoriations behind the knee  Neurological:     Mental Status: She is alert. Mental status is at baseline.  Psychiatric:        Mood and Affect: Mood normal.        Behavior: Behavior normal.      UA: +nitrites, neg LE, neg blood, Protein     Assessment & Plan:   Problem List Items Addressed This Visit      Respiratory   Asthma, chronic    Stable. Cont qvar. Cont prn albuterol. PPSV-23 today due to lung disease      Relevant Medications   albuterol (VENTOLIN HFA) 108 (90 Base) MCG/ACT inhaler   beclomethasone (QVAR) 80 MCG/ACT inhaler   albuterol (ACCUNEB) 0.63 MG/3ML nebulizer solution     Endocrine   Hypothyroidism    Last tsh at goal. Will repeat. Con levothyroxine 150 mcg      Relevant Medications   levothyroxine (SYNTHROID) 150 MCG tablet   Other Relevant Orders   TSH     Musculoskeletal and Integument   Dermatitis    Rash improved but not  resolved with hydrocortisone so will start triamcinolone. Call if not improving.         Genitourinary   Stage 3a chronic kidney disease (HCC)    Cont to limit NSAIDs. Repeat labs      Relevant Orders   Comprehensive metabolic panel     Other   Vitamin D deficiency    Has not been taking daily supplement. Will repeat due to fatigue. If low replace and then take daily supplement of 1000 units daily       Other Visit Diagnoses    Acute cystitis without hematuria    -  Primary   Hematuria, unspecified type       Relevant Orders   POCT Urinalysis Dipstick (Automated) (Completed)   Urine Culture   Mixed hyperlipidemia       Relevant Orders   Lipid Panel   Need for 23-polyvalent pneumococcal polysaccharide vaccine       Relevant Orders   Pneumococcal polysaccharide vaccine 23-valent greater than or equal to 2yo subcutaneous/IM (Completed)     UA with nitrites and symptoms concerning for infection - treat with Abx and send culture  Return in about 3 months (around 02/25/2020) for annual.  Lynnda Child,  MD  This visit occurred during the SARS-CoV-2 public health emergency.  Safety protocols were in place, including screening questions prior to the visit, additional usage of staff PPE, and extensive cleaning of exam room while observing appropriate contact time as indicated for disinfecting solutions.

## 2019-11-25 NOTE — Assessment & Plan Note (Signed)
Cont to limit NSAIDs. Repeat labs

## 2019-11-25 NOTE — Assessment & Plan Note (Signed)
Last tsh at goal. Will repeat. Con levothyroxine 150 mcg

## 2019-11-26 LAB — COMPREHENSIVE METABOLIC PANEL
ALT: 15 IU/L (ref 0–32)
AST: 10 IU/L (ref 0–40)
Albumin/Globulin Ratio: 1.5 (ref 1.2–2.2)
Albumin: 4.1 g/dL (ref 3.8–4.9)
Alkaline Phosphatase: 86 IU/L (ref 44–121)
BUN/Creatinine Ratio: 12 (ref 9–23)
BUN: 13 mg/dL (ref 6–24)
Bilirubin Total: 0.5 mg/dL (ref 0.0–1.2)
CO2: 26 mmol/L (ref 20–29)
Calcium: 8.9 mg/dL (ref 8.7–10.2)
Chloride: 106 mmol/L (ref 96–106)
Creatinine, Ser: 1.1 mg/dL — ABNORMAL HIGH (ref 0.57–1.00)
GFR calc Af Amer: 65 mL/min/{1.73_m2} (ref >59)
GFR calc non Af Amer: 57 mL/min/{1.73_m2} — ABNORMAL LOW (ref >59)
Globulin, Total: 2.8 g/dL (ref 1.5–4.5)
Glucose: 95 mg/dL (ref 65–99)
Potassium: 4.2 mmol/L (ref 3.5–5.2)
Sodium: 143 mmol/L (ref 134–144)
Total Protein: 6.9 g/dL (ref 6.0–8.5)

## 2019-11-26 LAB — LIPID PANEL
Chol/HDL Ratio: 5.3 ratio — ABNORMAL HIGH (ref 0.0–4.4)
Cholesterol, Total: 213 mg/dL — ABNORMAL HIGH (ref 100–199)
HDL: 40 mg/dL (ref >39)
LDL Chol Calc (NIH): 148 mg/dL — ABNORMAL HIGH (ref 0–99)
Triglycerides: 136 mg/dL (ref 0–149)
VLDL Cholesterol Cal: 25 mg/dL (ref 5–40)

## 2019-11-26 LAB — TSH: TSH: 0.761 u[IU]/mL (ref 0.450–4.500)

## 2019-12-01 ENCOUNTER — Telehealth: Payer: Self-pay | Admitting: Family Medicine

## 2019-12-01 LAB — URINE CULTURE

## 2019-12-01 MED ORDER — SULFAMETHOXAZOLE-TRIMETHOPRIM 800-160 MG PO TABS: 1.0000 | ORAL_TABLET | Freq: Two times a day (BID) | ORAL | 0 refills | Status: AC

## 2019-12-01 NOTE — Telephone Encounter (Signed)
Attempted to call patient and no answer.   Based on worsening symptoms and urine which should have responded to her previously antibiotic I am worried that she may have a kidney infection. This would require re-evaluation in the office to determine and if present would also require a shot of antibiotics followed by a different oral antibiotic to treat.

## 2019-12-01 NOTE — Addendum Note (Signed)
Addended by: Lynnda Child on: 12/01/2019 03:26 PM   Modules accepted: Orders

## 2019-12-01 NOTE — Telephone Encounter (Signed)
Spoke to pt and she states that she is not able to come into the office due to her work schedule. The antibiotic she is currently taking has given her horrible diarrhea. She has requested that the antibiotic, Septra, get called in, as this has been effective in the past. She states that she has suffered for too long.

## 2019-12-01 NOTE — Telephone Encounter (Signed)
States antibiotic is not working . Hurting really bad. In her back and while urinating.

## 2019-12-01 NOTE — Telephone Encounter (Signed)
Please see if patient can come in today for re-evaluation due to worsening symptoms. OK with add-on this afternoon

## 2019-12-01 NOTE — Telephone Encounter (Signed)
Called patient and attempted to schedule as per PCP. Pt stated she did not want to come in all she wanted was to have different medicine called in. Please advise.

## 2019-12-01 NOTE — Telephone Encounter (Signed)
Called pt to relay that standard of care would be injection of Ceftriaxone followed by septra. That not doing so and only getting septra may result in worsening infection and need for ER.   She expressed understanding and is unable to come in for Ceftriaxone and would like to try Septra alone for worsening UTI  Culture is sensitive to Septra and her current antibiotic of nitrofuratoin.   7 day treatment for presumed pyelonephritis

## 2019-12-18 ENCOUNTER — Telehealth: Payer: Self-pay

## 2019-12-18 DIAGNOSIS — L309 Dermatitis, unspecified: Secondary | ICD-10-CM

## 2019-12-18 MED ORDER — CLOBETASOL PROPIONATE 0.05 % EX OINT: 1.0000 "application " | TOPICAL_OINTMENT | Freq: Two times a day (BID) | CUTANEOUS | 0 refills | Status: DC

## 2019-12-18 NOTE — Telephone Encounter (Signed)
Pt called to report that she has noticed minimal relief with the triamcinolone and states you told her to call back if no improvement...Marland Kitchen please advise

## 2019-12-18 NOTE — Telephone Encounter (Signed)
Left a detailed message (DPR) for pt relaying all of Dr. Elmyra Ricks info and suggestions. Told pt to please call us back if she has any questions.

## 2019-12-18 NOTE — Telephone Encounter (Signed)
Please clarify if she has any improvement with the triamcinolone.   If some improvement - I've sent in Clobetasol ointment this is a stronger steroid to try.   I will also place a dermatology referral if it does not resolve with the ointment.   If no change in the rash - would recommend dermatology follow-up and no cream at this time  If worsening and delay for dermatology could make appointment next week for re-evaluation.

## 2019-12-21 ENCOUNTER — Telehealth: Payer: Self-pay

## 2019-12-21 NOTE — Telephone Encounter (Signed)
Called both numbers in the chart and left message to call back to relay information in regards to the dermatologist appointment.  Appointment made for 01/05/20 at 3:30 pm with Dr Margo Aye Address: : [address], [place], [place] [address] Phone: [phone]

## 2019-12-23 NOTE — Telephone Encounter (Signed)
Called both numbers in the chart and left a message on cell number, not able to leave a message on home number

## 2019-12-28 NOTE — Telephone Encounter (Signed)
Called patient on both numbers and called emergency contact, Ms Yetta Flock, and asked her to have patient call me back.

## 2019-12-29 NOTE — Telephone Encounter (Signed)
Letter mailed to patient with this information.

## 2020-02-29 ENCOUNTER — Ambulatory Visit: Payer: 59 | Admitting: Family Medicine

## 2020-04-23 ENCOUNTER — Other Ambulatory Visit: Payer: Self-pay | Admitting: Family Medicine

## 2020-04-23 DIAGNOSIS — J4540 Moderate persistent asthma, uncomplicated: Secondary | ICD-10-CM

## 2020-11-25 ENCOUNTER — Telehealth: Payer: Self-pay | Admitting: Family Medicine

## 2020-11-25 DIAGNOSIS — J4540 Moderate persistent asthma, uncomplicated: Secondary | ICD-10-CM

## 2020-11-29 NOTE — Telephone Encounter (Signed)
Call patient and have her scheduled with any provider.  Notify her that she will need to be seen for follow-up for further refills.

## 2020-11-29 NOTE — Telephone Encounter (Signed)
LOV 11/25/19 and patient did not keep her follow up appointment as requested. When does patient need to follow up at this time? Wait on Dr Selena Batten to return or see someone else?

## 2020-11-30 NOTE — Telephone Encounter (Signed)
2nd attempt LMTCB to schedule appt  

## 2020-11-30 NOTE — Telephone Encounter (Signed)
Called patient and lvmtcb to schedule apt

## 2020-11-30 NOTE — Telephone Encounter (Signed)
Please call patient and have her scheduled with any provider.  Notify her that she will need to be seen for a follow-up for further refills.

## 2021-02-15 ENCOUNTER — Other Ambulatory Visit: Payer: Self-pay | Admitting: Family Medicine

## 2021-02-15 DIAGNOSIS — E039 Hypothyroidism, unspecified: Secondary | ICD-10-CM

## 2021-02-28 NOTE — Telephone Encounter (Signed)
Lvm for pt to schedule a cpe/lab °

## 2021-03-16 NOTE — Telephone Encounter (Signed)
2nd attempt to reach pt lvm for pt to call to schedule cpe/lab ?

## 2022-05-17 ENCOUNTER — Encounter: Payer: Self-pay | Admitting: Internal Medicine

## 2022-05-17 ENCOUNTER — Ambulatory Visit: Payer: 59 | Admitting: Internal Medicine

## 2022-05-17 VITALS — BP 140/80 | HR 104 | Temp 96.6°F | Ht 69.0 in | Wt 334.0 lb

## 2022-05-17 DIAGNOSIS — N1831 Chronic kidney disease, stage 3a: Secondary | ICD-10-CM

## 2022-05-17 DIAGNOSIS — I10 Essential (primary) hypertension: Secondary | ICD-10-CM | POA: Diagnosis not present

## 2022-05-17 DIAGNOSIS — J4540 Moderate persistent asthma, uncomplicated: Secondary | ICD-10-CM

## 2022-05-17 DIAGNOSIS — J4520 Mild intermittent asthma, uncomplicated: Secondary | ICD-10-CM

## 2022-05-17 DIAGNOSIS — E039 Hypothyroidism, unspecified: Secondary | ICD-10-CM | POA: Diagnosis not present

## 2022-05-17 MED ORDER — ALBUTEROL SULFATE HFA 108 (90 BASE) MCG/ACT IN AERS
INHALATION_SPRAY | RESPIRATORY_TRACT | 1 refills | Status: DC
Start: 2022-05-17 — End: 2023-11-21

## 2022-05-17 MED ORDER — VALSARTAN 80 MG PO TABS: 80.0000 mg | ORAL_TABLET | Freq: Every day | ORAL | 1 refills | Status: DC

## 2022-05-17 MED ORDER — LEVOTHYROXINE SODIUM 150 MCG PO TABS: 150.0000 ug | ORAL_TABLET | Freq: Every day | ORAL | 1 refills | Status: DC

## 2022-05-17 MED ORDER — QVAR REDIHALER 80 MCG/ACT IN AERB
2.0000 | INHALATION_SPRAY | Freq: Two times a day (BID) | RESPIRATORY_TRACT | 5 refills | Status: DC
Start: 2022-05-17 — End: 2023-11-21

## 2022-05-17 NOTE — Progress Notes (Signed)
Subjective:    Patient ID: Sarah Austin, female    DOB: April 15, 1965, 57 y.o.   MRN: [id number]  HPI Here due to concerns about her blood pressure  Has been checking BP at home 161/89 has been the highest---very variable but often in the 150's Some headaches--concerned it was from that. Dull aching--frontal  Ran out of levothyroxine for months  No chest pain Some trouble breathing---missing her inhalers Didn't need the albuterol much when she was on the Qvar No regular cough No syncope---occ mild lightheaded feeling  Current Outpatient Medications on File Prior to Visit  Medication Sig Dispense Refill   fexofenadine (ALLEGRA) 180 MG tablet Take 180 mg by mouth daily.     Multiple Vitamins-Minerals (MULTIVITAMIN WOMEN 50+ PO) Take by mouth.     albuterol (ACCUNEB) 0.63 MG/3ML nebulizer solution Take 3 mLs (0.63 mg total) by nebulization every 6 (six) hours as needed for wheezing. (Patient not taking: Reported on 05/17/2022) 75 mL 1   albuterol (VENTOLIN HFA) 108 (90 Base) MCG/ACT inhaler TAKE 2 PUFFS BY MOUTH EVERY 6 HOURS AS NEEDED FOR WHEEZE OR SHORTNESS OF BREATH (Patient not taking: Reported on 05/17/2022) 6.7 each 3   beclomethasone (QVAR REDIHALER) 80 MCG/ACT inhaler Inhale 2 puffs into the lungs 2 (two) times daily. Office visit required for further refills. (Patient not taking: Reported on 05/17/2022) 10.6 g 0   levothyroxine (SYNTHROID) 150 MCG tablet Take 1 tablet (150 mcg total) by mouth daily before breakfast. (Patient not taking: Reported on 05/17/2022) 90 tablet 3   No current facility-administered medications on file prior to visit.    No Known Allergies  Past Medical History:  Diagnosis Date   Allergy    Asthma    Obesity    Sleep apnea    Thyroid disease    Vitamin B12 deficiency    Vitamin D deficiency     Past Surgical History:  Procedure Laterality Date   ENDOMETRIAL BIOPSY  05/19/2014   at [facility]- benign endometrial polyps   HYSTEROSCOPY WITH D & C N/A  07/12/2014   Procedure: DILATATION AND CURETTAGE /HYSTEROSCOPY;  Surgeon: Herold Harms, MD;  Location: [facility] ORS;  Service: Gynecology;  Laterality: N/A;   URETHRA SURGERY     1969   WISDOM TOOTH EXTRACTION      Family History  Problem Relation Age of Onset   Arthritis Mother    Hypertension Mother    Diabetes Mother    Uterine cancer Mother    Heart disease Father    Stroke Father 11   Dementia Father    Heart disease Sister    Thyroid disease Sister    Lung cancer Maternal Grandmother    Asthma Daughter    Dementia Paternal Grandmother    Breast cancer Neg Hx    Ovarian cancer Neg Hx    Colon cancer Neg Hx     Social History   Socioeconomic History   Marital status: Divorced    Spouse name: Not on file   Number of children: 1   Years of education: college   Highest education level: Not on file  Occupational History   Not on file  Tobacco Use   Smoking status: Never   Smokeless tobacco: Never  Vaping Use   Vaping Use: Never used  Substance and Sexual Activity   Alcohol use: No   Drug use: No   Sexual activity: Not Currently  Other Topics Concern   Not on file  Social History Narrative  01/01/19   From: the area   Living: lives alone   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    Pets: 2 beagles       Family: Daughter - Mackie Pai - has a good relationship, lives nearby      Enjoys: cooking, working Dispensing optician: Yes    Guns: Yes  and secure   Safe in relationships: Yes    Social Determinants of Corporate investment banker Strain: Not on Ship broker Insecurity: Not on file  Transportation Needs: Not on file  Physical Activity: Not on file  Stress: Not on file  Social Connections: Not on file  Intimate Partner Violence: Not on file   Review of Systems Slowly gaining weight ---20# over the past few years Feels this has been since mom died Plans to get back to walking    Objective:   Physical Exam Constitutional:       Appearance: Normal appearance.  Cardiovascular:     Rate and Rhythm: Normal rate and regular rhythm.     Heart sounds: No murmur heard.    No gallop.  Pulmonary:     Effort: Pulmonary effort is normal.     Breath sounds: Normal breath sounds. No wheezing or rales.  Musculoskeletal:     Cervical back: Neck supple.     Right lower leg: No edema.     Left lower leg: No edema.  Lymphadenopathy:     Cervical: No cervical adenopathy.  Neurological:     Mental Status: She is alert.  Psychiatric:        Mood and Affect: Mood normal.        Behavior: Behavior normal.            Assessment & Plan:

## 2022-05-17 NOTE — Assessment & Plan Note (Signed)
Had normal testing on levothyroxine 150  Will restart this

## 2022-05-17 NOTE — Assessment & Plan Note (Signed)
Has had persistent GFR in 50's Will plan to continue valsartan if tolerated--even if BP comes down a lot

## 2022-05-17 NOTE — Assessment & Plan Note (Signed)
BP Readings from Last 3 Encounters:  05/17/22 (!) 140/80  11/25/19 126/78  01/01/19 128/86   Repeat by me on right is 15078 She wants to aggressive about control--especially the headaches are related Will start valsartan 80  Restarting treatment for her thyroid may also BP--she will monitor for dizziness

## 2022-05-17 NOTE — Assessment & Plan Note (Signed)
No exacerbation now but is symptomatic Will restart the Qvar 80--formerly 2 puffs bid Refill rescue inhaler as well

## 2022-07-12 ENCOUNTER — Ambulatory Visit: Payer: 59 | Admitting: Internal Medicine

## 2022-07-12 ENCOUNTER — Encounter: Payer: Self-pay | Admitting: Internal Medicine

## 2022-07-12 VITALS — BP 104/70 | HR 95 | Temp 97.8°F | Ht 69.0 in | Wt 337.0 lb

## 2022-07-12 DIAGNOSIS — N1831 Chronic kidney disease, stage 3a: Secondary | ICD-10-CM | POA: Diagnosis not present

## 2022-07-12 DIAGNOSIS — E039 Hypothyroidism, unspecified: Secondary | ICD-10-CM

## 2022-07-12 DIAGNOSIS — Z1211 Encounter for screening for malignant neoplasm of colon: Secondary | ICD-10-CM

## 2022-07-12 DIAGNOSIS — J4520 Mild intermittent asthma, uncomplicated: Secondary | ICD-10-CM

## 2022-07-12 DIAGNOSIS — I10 Essential (primary) hypertension: Secondary | ICD-10-CM | POA: Diagnosis not present

## 2022-07-12 NOTE — Assessment & Plan Note (Signed)
Has been in the 50's Now on valsartan Will check labs

## 2022-07-12 NOTE — Addendum Note (Signed)
Addended by: Shon Millet on: 07/12/2022 04:03 PM   Modules accepted: Orders

## 2022-07-12 NOTE — Progress Notes (Signed)
Subjective:    Patient ID: Sarah Austin, female    DOB: 01-28-1965, 57 y.o.   MRN: [id number]  HPI Here for follow up of HTN and other chronic health conditions  Checking BP just about every day Much better Usually 112/60's No dizziness or syncope No chest pain or SOB Has been walking some  Breathing is good Hasn't needed the rescue inhaler since on Q-var  Back on the thyroid  Current Outpatient Medications on File Prior to Visit  Medication Sig Dispense Refill   albuterol (VENTOLIN HFA) 108 (90 Base) MCG/ACT inhaler TAKE 2 PUFFS BY MOUTH EVERY 6 HOURS AS NEEDED FOR WHEEZE OR SHORTNESS OF BREATH 6.7 each 1   beclomethasone (QVAR REDIHALER) 80 MCG/ACT inhaler Inhale 2 puffs into the lungs 2 (two) times daily. Office visit required for further refills. 10.6 g 5   fexofenadine (ALLEGRA) 180 MG tablet Take 180 mg by mouth daily.     levothyroxine (SYNTHROID) 150 MCG tablet Take 1 tablet (150 mcg total) by mouth daily before breakfast. 90 tablet 1   Multiple Vitamins-Minerals (MULTIVITAMIN WOMEN 50+ PO) Take by mouth.     valsartan (DIOVAN) 80 MG tablet Take 1 tablet (80 mg total) by mouth daily. 90 tablet 1   No current facility-administered medications on file prior to visit.    No Known Allergies  Past Medical History:  Diagnosis Date   Allergy    Asthma    Obesity    Sleep apnea    Thyroid disease    Vitamin B12 deficiency    Vitamin D deficiency     Past Surgical History:  Procedure Laterality Date   ENDOMETRIAL BIOPSY  05/19/2014   at [facility]- benign endometrial polyps   HYSTEROSCOPY WITH D & C N/A 07/12/2014   Procedure: DILATATION AND CURETTAGE /HYSTEROSCOPY;  Surgeon: Herold Harms, MD;  Location: [facility] ORS;  Service: Gynecology;  Laterality: N/A;   URETHRA SURGERY     1969   WISDOM TOOTH EXTRACTION      Family History  Problem Relation Age of Onset   Arthritis Mother    Hypertension Mother    Diabetes Mother    Uterine cancer Mother    Heart  disease Father    Stroke Father 52   Dementia Father    Heart disease Sister    Thyroid disease Sister    Lung cancer Maternal Grandmother    Asthma Daughter    Dementia Paternal Grandmother    Breast cancer Neg Hx    Ovarian cancer Neg Hx    Colon cancer Neg Hx     Social History   Socioeconomic History   Marital status: Divorced    Spouse name: Not on file   Number of children: 1   Years of education: college   Highest education level: Not on file  Occupational History   Not on file  Tobacco Use   Smoking status: Never    Passive exposure: Never   Smokeless tobacco: Never  Vaping Use   Vaping status: Never Used  Substance and Sexual Activity   Alcohol use: No   Drug use: No   Sexual activity: Not Currently  Other Topics Concern   Not on file  Social History Narrative   01/01/19   From: the area   Living: lives alone   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 Discussed cutting out sugared drinks      Objective:   Physical Exam Constitutional:      Appearance: Normal appearance.  Cardiovascular:     Rate and Rhythm: Normal rate and regular rhythm.     Heart sounds: No murmur heard.    No gallop.  Pulmonary:     Effort: Pulmonary effort is normal.     Breath sounds: Normal breath sounds. No wheezing or rales.  Musculoskeletal:     Cervical back: Neck supple.     Comments: Thick calves without  pitting  Lymphadenopathy:     Cervical: No cervical adenopathy.  Neurological:     Mental Status: She is alert.  Psychiatric:        Mood and Affect: Mood normal.        Behavior: Behavior normal.            Assessment & Plan:

## 2022-07-12 NOTE — Assessment & Plan Note (Signed)
Now back on Rx Levothyroxine Will check labs

## 2022-07-12 NOTE — Assessment & Plan Note (Signed)
BP Readings from Last 3 Encounters:  07/12/22 104/70  05/17/22 (!) 140/80  11/25/19 126/78   Now well controlled---even at home Valsartan 80

## 2022-07-12 NOTE — Assessment & Plan Note (Signed)
No symptoms on the daily Q-var

## 2022-07-12 NOTE — Addendum Note (Signed)
Addended by: Tillman Abide I on: 07/12/2022 03:57 PM   Modules accepted: Orders

## 2022-07-13 ENCOUNTER — Telehealth: Payer: Self-pay

## 2022-07-13 LAB — CBC
Hemoglobin: 12.6 g/dL (ref 11.1–15.9)
Platelets: 151 10*3/uL (ref 150–450)
RBC: 4.26 x10E6/uL (ref 3.77–5.28)
RDW: 13.5 % (ref 11.7–15.4)
WBC: 8.5 10*3/uL (ref 3.4–10.8)

## 2022-07-13 LAB — HEPATIC FUNCTION PANEL
AST: 17 IU/L (ref 0–40)
Alkaline Phosphatase: 88 IU/L (ref 44–121)
Bilirubin, Direct: 0.12 mg/dL (ref 0.00–0.40)
Total Protein: 7 g/dL (ref 6.0–8.5)

## 2022-07-13 LAB — RENAL FUNCTION PANEL
Albumin: 4.1 g/dL (ref 3.8–4.9)
BUN/Creatinine Ratio: 11 (ref 9–23)
BUN: 14 mg/dL (ref 6–24)
Calcium: 8.7 mg/dL (ref 8.7–10.2)
Phosphorus: 3.6 mg/dL (ref 3.0–4.3)

## 2022-07-13 LAB — LIPID PANEL: HDL: 41 mg/dL (ref >39)

## 2022-07-13 NOTE — Telephone Encounter (Signed)
-----   Message from Tillman Abide sent at 07/13/2022  7:44 AM EDT ----- Results released Check with her about B12--if taking, start injections, if not--start the OTC regimen and set up again in 4-6 weeks Start vitamin D OTC Find out if she wants to see a kidney specialist

## 2022-07-13 NOTE — Telephone Encounter (Signed)
Spoke to pt

## 2022-07-13 NOTE — Progress Notes (Signed)
Thank you Dr. Alphonsus Sias for the update. I appreciate it.

## 2022-07-14 LAB — CBC
Hematocrit: 37.5 % (ref 34.0–46.6)
MCH: 29.6 pg (ref 26.6–33.0)
MCHC: 33.6 g/dL (ref 31.5–35.7)
MCV: 88 fL (ref 79–97)

## 2022-07-14 LAB — LIPID PANEL
Chol/HDL Ratio: 5.1 ratio — ABNORMAL HIGH (ref 0.0–4.4)
Cholesterol, Total: 210 mg/dL — ABNORMAL HIGH (ref 100–199)
LDL Chol Calc (NIH): 128 mg/dL — ABNORMAL HIGH (ref 0–99)
Triglycerides: 233 mg/dL — ABNORMAL HIGH (ref 0–149)
VLDL Cholesterol Cal: 41 mg/dL — ABNORMAL HIGH (ref 5–40)

## 2022-07-14 LAB — VITAMIN D 25 HYDROXY (VIT D DEFICIENCY, FRACTURES): Vit D, 25-Hydroxy: 14.4 ng/mL — ABNORMAL LOW (ref 30.0–100.0)

## 2022-07-14 LAB — RENAL FUNCTION PANEL
CO2: 19 mmol/L — ABNORMAL LOW (ref 20–29)
Chloride: 106 mmol/L (ref 96–106)
Creatinine, Ser: 1.29 mg/dL — ABNORMAL HIGH (ref 0.57–1.00)
Glucose: 98 mg/dL (ref 70–99)
Potassium: 3.9 mmol/L (ref 3.5–5.2)
Sodium: 140 mmol/L (ref 134–144)
eGFR: 48 mL/min/{1.73_m2} — ABNORMAL LOW (ref >59)

## 2022-07-14 LAB — T4, FREE: Free T4: 1.27 ng/dL (ref 0.82–1.77)

## 2022-07-14 LAB — PARATHYROID HORMONE, INTACT (NO CA): PTH: 74 pg/mL — ABNORMAL HIGH (ref 15–65)

## 2022-07-14 LAB — TSH: TSH: 2.1 u[IU]/mL (ref 0.450–4.500)

## 2022-07-14 LAB — HEPATIC FUNCTION PANEL
ALT: 20 IU/L (ref 0–32)
Bilirubin Total: 0.4 mg/dL (ref 0.0–1.2)

## 2022-07-14 LAB — VITAMIN B12: Vitamin B-12: 159 pg/mL — ABNORMAL LOW (ref 232–1245)

## 2022-08-06 ENCOUNTER — Encounter: Payer: Self-pay | Admitting: Family

## 2022-08-06 ENCOUNTER — Ambulatory Visit: Payer: 59 | Admitting: Family

## 2022-08-06 VITALS — BP 120/80 | HR 100 | Temp 98.6°F | Ht 69.0 in | Wt 337.0 lb

## 2022-08-06 DIAGNOSIS — Z6841 Body Mass Index (BMI) 40.0 and over, adult: Secondary | ICD-10-CM

## 2022-08-06 DIAGNOSIS — E538 Deficiency of other specified B group vitamins: Secondary | ICD-10-CM

## 2022-08-06 DIAGNOSIS — R7989 Other specified abnormal findings of blood chemistry: Secondary | ICD-10-CM | POA: Diagnosis not present

## 2022-08-06 DIAGNOSIS — E559 Vitamin D deficiency, unspecified: Secondary | ICD-10-CM | POA: Diagnosis not present

## 2022-08-06 DIAGNOSIS — N1832 Chronic kidney disease, stage 3b: Secondary | ICD-10-CM

## 2022-08-06 DIAGNOSIS — R944 Abnormal results of kidney function studies: Secondary | ICD-10-CM

## 2022-08-06 DIAGNOSIS — Z1211 Encounter for screening for malignant neoplasm of colon: Secondary | ICD-10-CM | POA: Insufficient documentation

## 2022-08-06 DIAGNOSIS — E039 Hypothyroidism, unspecified: Secondary | ICD-10-CM

## 2022-08-06 DIAGNOSIS — G4733 Obstructive sleep apnea (adult) (pediatric): Secondary | ICD-10-CM

## 2022-08-06 DIAGNOSIS — I10 Essential (primary) hypertension: Secondary | ICD-10-CM

## 2022-08-06 DIAGNOSIS — E782 Mixed hyperlipidemia: Secondary | ICD-10-CM | POA: Insufficient documentation

## 2022-08-06 DIAGNOSIS — Z1231 Encounter for screening mammogram for malignant neoplasm of breast: Secondary | ICD-10-CM | POA: Insufficient documentation

## 2022-08-06 MED ORDER — VITAMIN B-12 1000 MCG PO TABS: 1000.0000 ug | ORAL_TABLET | Freq: Every day | ORAL | 3 refills | Status: DC

## 2022-08-06 MED ORDER — VITAMIN D3 25 MCG (1000 UT) PO CAPS
1000.0000 [IU] | ORAL_CAPSULE | Freq: Every day | ORAL | 3 refills | Status: AC
Start: 2022-08-06 — End: ?

## 2022-08-06 MED ORDER — CYANOCOBALAMIN 1000 MCG/ML IJ SOLN
1000.0000 ug | Freq: Once | INTRAMUSCULAR | Status: AC
Start: 2022-08-06 — End: 2022-08-06
  Administered 2022-08-06: 1000 ug via INTRAMUSCULAR

## 2022-08-06 MED ORDER — PRAVASTATIN SODIUM 10 MG PO TABS: 10.0000 mg | ORAL_TABLET | Freq: Every day | ORAL | 3 refills | Status: DC

## 2022-08-06 NOTE — Progress Notes (Signed)
Established Patient Office Visit  Subjective:  Patient ID: Sarah Austin, female    DOB: 1965-08-06  Age: 57 y.o. MRN: [id number]  CC:  Chief Complaint  Patient presents with   Establish Care    Seen by Dr. Selena Batten in the past needs referral  kidney specialist per last labs.     HPI Sarah Austin is here for a transition of care visit.  Prior provider was: Dr. Gweneth Dimitri   HTN: on valsartan 80 mg once daily. Has been low at home, dizzy at times. When she checks her blood pressure at home 96/64.   Hypothyroid: on levothyroxine 150 mcg once daily.  Lab Results  Component Value Date   TSH 2.100 07/12/2022   OSA: tired in the day at times, was apneic was supposed to be using a cpap but stopped using. States not as tired as she used to be during the day.    Asthma: on qvar, with improvement. Albuterol intermittently a few times a week.   Pt is without acute concerns.   Pap: last in 2018.  Tetanus vaccination: states had this in urgent care within the last three years.  Colonoscopy: declines   chronic concerns:  CKD stage 3  With elevated PTH  Lab Results  Component Value Date   NA 140 07/12/2022   CL 106 07/12/2022   K 3.9 07/12/2022   CO2 19 (L) 07/12/2022   BUN 14 07/12/2022   CREATININE 1.29 (H) 07/12/2022   EGFR 48 (L) 07/12/2022   CALCIUM 8.7 07/12/2022   PHOS 3.6 07/12/2022   ALBUMIN 4.1 07/12/2022   GLUCOSE 98 07/12/2022   Vitamin b12 def: was recommend for b12 injections. She has not yet started this.   Hld: pt was not fasting at this visit.  LDL 128, goal is less than 70.   Pt requesting referral to cardiology. She states her dad had heart disease with a stent, possible blockage. Mom had afib. Sister with heart disease, she thinks it was due to fire wood stove when they were young. She denies any symptoms to include doe, sob and or palpitations or chest pain.   Past Medical History:  Diagnosis Date   Allergy    Asthma    Obesity    Sleep  apnea    Thyroid disease    Vitamin B12 deficiency    Vitamin D deficiency     Past Surgical History:  Procedure Laterality Date   ENDOMETRIAL BIOPSY  05/19/2014   at [facility]- benign endometrial polyps   HYSTEROSCOPY WITH D & C N/A 07/12/2014   Procedure: DILATATION AND CURETTAGE /HYSTEROSCOPY;  Surgeon: Herold Harms, MD;  Location: [facility] ORS;  Service: Gynecology;  Laterality: N/A;   URETHRA SURGERY     1969 congential   WISDOM TOOTH EXTRACTION      Family History  Problem Relation Age of Onset   Arthritis Mother    Hypertension Mother    Diabetes Mother    Uterine cancer Mother    Atrial fibrillation Mother    Heart disease Father    Stroke Father 63   Dementia Father    Heart disease Sister    Thyroid disease Sister    Lung cancer Maternal Grandmother    Dementia Paternal Grandmother    Asthma Daughter    Breast cancer Neg Hx    Ovarian cancer Neg Hx    Colon cancer Neg Hx     Social History   Socioeconomic History   Marital  status: Divorced    Spouse name: Not on file   Number of children: 1   Years of education: college   Highest education level: Not on file  Occupational History    Employer: [employer]    Comment: customer service agent  Tobacco Use   Smoking status: Never    Passive exposure: Never   Smokeless tobacco: Never  Vaping Use   Vaping status: Never Used  Substance and Sexual Activity   Alcohol use: No   Drug use: No   Sexual activity: Not Currently  Other Topics Concern   Not on file  Social History Narrative   01/01/19   From: the area   Living: lives alone   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    Pets: 2 beagles       Family: Daughter - Sarah Austin - has a good relationship, lives nearby      Enjoys: cooking, working Agricultural engineer belts: Yes    Guns: Yes  and secure   Safe in relationships: Yes    Social Determinants of Corporate investment banker Strain: Not on file  Food Insecurity: Not on file   Transportation Needs: Not on file  Physical Activity: Not on file  Stress: Not on file  Social Connections: Not on file  Intimate Partner Violence: Not on file    Outpatient Medications Prior to Visit  Medication Sig Dispense Refill   albuterol (VENTOLIN HFA) 108 (90 Base) MCG/ACT inhaler TAKE 2 PUFFS BY MOUTH EVERY 6 HOURS AS NEEDED FOR WHEEZE OR SHORTNESS OF BREATH 6.7 each 1   beclomethasone (QVAR REDIHALER) 80 MCG/ACT inhaler Inhale 2 puffs into the lungs 2 (two) times daily. Office visit required for further refills. 10.6 g 5   fexofenadine (ALLEGRA) 180 MG tablet Take 180 mg by mouth daily.     levothyroxine (SYNTHROID) 150 MCG tablet Take 1 tablet (150 mcg total) by mouth daily before breakfast. 90 tablet 1   Multiple Vitamins-Minerals (MULTIVITAMIN WOMEN 50+ PO) Take by mouth.     valsartan (DIOVAN) 80 MG tablet Take 1 tablet (80 mg total) by mouth daily. 90 tablet 1   No facility-administered medications prior to visit.    No Known Allergies  ROS: Pertinent symptoms negative unless otherwise noted in HPI      Objective:    Physical Exam Vitals reviewed.  Constitutional:      Appearance: Normal appearance. She is obese.  Eyes:     General:        Right eye: No discharge.        Left eye: No discharge.     Conjunctiva/sclera: Conjunctivae normal.  Cardiovascular:     Rate and Rhythm: Normal rate and regular rhythm.  Pulmonary:     Effort: Pulmonary effort is normal. No respiratory distress.     Breath sounds: Normal breath sounds.  Musculoskeletal:        General: Normal range of motion.     Cervical back: Normal range of motion.  Neurological:     General: No focal deficit present.     Mental Status: She is alert and oriented to person, place, and time. Mental status is at baseline.  Psychiatric:        Mood and Affect: Mood normal.        Behavior: Behavior normal.        Thought Content: Thought content normal.        Judgment: Judgment normal.  BP 120/80   Pulse 100   Temp 98.6 F (37 C) (Temporal)   Ht 5\' 9"  (1.753 m)   Wt (!) 337 lb (152.9 kg)   LMP 04/02/2014   SpO2 98%   BMI 49.77 kg/m  Wt Readings from Last 3 Encounters:  08/06/22 (!) 337 lb (152.9 kg)  07/12/22 (!) 337 lb (152.9 kg)  05/17/22 (!) 334 lb (151.5 kg)     Health Maintenance Due  Topic Date Due   Fecal DNA (Cologuard)  Never done   MAMMOGRAM  07/15/2015   COVID-19 Vaccine (6 - 2023-24 season) 09/01/2021   INFLUENZA VACCINE  08/02/2022    There are no preventive care reminders to display for this patient.  Lab Results  Component Value Date   TSH 2.100 07/12/2022   Lab Results  Component Value Date   WBC 8.5 07/12/2022   HGB 12.6 07/12/2022   HCT 37.5 07/12/2022   MCV 88 07/12/2022   PLT 151 07/12/2022   Lab Results  Component Value Date   NA 140 07/12/2022   K 3.9 07/12/2022   CO2 19 (L) 07/12/2022   GLUCOSE 98 07/12/2022   BUN 14 07/12/2022   CREATININE 1.29 (H) 07/12/2022   BILITOT 0.4 07/12/2022   ALKPHOS 88 07/12/2022   AST 17 07/12/2022   ALT 20 07/12/2022   PROT 7.0 07/12/2022   ALBUMIN 4.1 07/12/2022   CALCIUM 8.7 07/12/2022   EGFR 48 (L) 07/12/2022   Lab Results  Component Value Date   CHOL 210 (H) 07/12/2022   Lab Results  Component Value Date   HDL 41 07/12/2022   Lab Results  Component Value Date   LDLCALC 128 (H) 07/12/2022   Lab Results  Component Value Date   TRIG 233 (H) 07/12/2022   Lab Results  Component Value Date   CHOLHDL 5.1 (H) 07/12/2022   Lab Results  Component Value Date   HGBA1C 5.2 01/01/2019      Assessment & Plan:   Obstructive apnea  Screening for colon cancer -     Cologuard  Screening mammogram for breast cancer -     3D Screening Mammogram, Left and Right; Future  Vitamin D deficiency Assessment & Plan: Ordered vitamin d pending results.    Orders: -     Vitamin D3; Take 1 capsule (1,000 Units total) by mouth daily.  Dispense: 90 capsule; Refill: 3 -      VITAMIN D 25 Hydroxy (Vit-D Deficiency, Fractures); Future  Elevated PTHrP level Assessment & Plan: Suspect from low vitamin D.  Supplement vitamin D and will repeat pth   Orders: -     PTH, intact and calcium; Future  Vitamin B12 deficiency Assessment & Plan: Recommendation for daily otc vitamin B12 1000 mcg  B12 injection in office today , will complete 2 more once monthly for total of 3  Orders: -     Vitamin B-12; Take 1 tablet (1,000 mcg total) by mouth daily.  Dispense: 90 tablet; Refill: 3 -     Cyanocobalamin -     Vitamin B12; Future  Mixed hyperlipidemia Assessment & Plan: Ordered lipid panel, pending results. Work on low cholesterol diet and exercise as tolerated Recommendation to start pravastatin 10 mg, pt is agreeable. Repeat lipid panel x 3 months fasting.   Orders: -     Pravastatin Sodium; Take 1 tablet (10 mg total) by mouth daily.  Dispense: 90 tablet; Refill: 3 -     Lipid panel; Future  HTN (hypertension), benign  Assessment & Plan: stable   Decreased GFR -     Ambulatory referral to Nephrology -     US RENAL; Future  Elevated serum creatinine -     Ambulatory referral to Nephrology -     US RENAL; Future  Stage 3b chronic kidney disease (HCC) Assessment & Plan: Elevated pth, also low vitamin D starting supplementation may decrease pth likely.  Although also consider kidney cause.  Declining GFR, pt was seeing nephrology in the past and they retired.  Advised will refer to nephrology but will obtain u/s renal to r/o obstruction/struture abn   Orders: -     Ambulatory referral to Nephrology -     US RENAL; Future  Acquired hypothyroidism Assessment & Plan: Within goal.  Continue levothyroxine 150 mg once daily Advised pt to seperate from food and or other medications by at least 30 min to one hour  Separate from vitamins and acid reducing medications by at least four hours.     Morbid obesity (HCC) Assessment & Plan: Pt advised  to work on diet and exercise as tolerated      Meds ordered this encounter  Medications   Cholecalciferol (VITAMIN D3) 25 MCG (1000 UT) CAPS    Sig: Take 1 capsule (1,000 Units total) by mouth daily.    Dispense:  90 capsule    Refill:  3    Order Specific Question:   Supervising Provider    Answer:   BEDSOLE, AMY E [[id number]]   cyanocobalamin (VITAMIN B12) 1000 MCG tablet    Sig: Take 1 tablet (1,000 mcg total) by mouth daily.    Dispense:  90 tablet    Refill:  3    Order Specific Question:   Supervising Provider    Answer:   BEDSOLE, AMY E [[id number]]   pravastatin (PRAVACHOL) 10 MG tablet    Sig: Take 1 tablet (10 mg total) by mouth daily.    Dispense:  90 tablet    Refill:  3    Order Specific Question:   Supervising Provider    Answer:   Ermalene Searing, AMY E [[id number]]   cyanocobalamin (VITAMIN B12) injection 1,000 mcg    Follow-up: Return in about 6 months (around 02/06/2023) for f/u diabetes, f/u blood pressure.    Mort Sawyers, FNP

## 2022-08-06 NOTE — Assessment & Plan Note (Signed)
Suspect from low vitamin D.  Supplement vitamin D and will repeat pth

## 2022-08-06 NOTE — Assessment & Plan Note (Signed)
Within goal.  Continue levothyroxine 150 mg once daily Advised pt to seperate from food and or other medications by at least 30 min to one hour  Separate from vitamins and acid reducing medications by at least four hours.

## 2022-08-06 NOTE — Assessment & Plan Note (Signed)
stable °

## 2022-08-06 NOTE — Assessment & Plan Note (Signed)
Ordered lipid panel, pending results. Work on low cholesterol diet and exercise as tolerated Recommendation to start pravastatin 10 mg, pt is agreeable. Repeat lipid panel x 3 months fasting.

## 2022-08-06 NOTE — Assessment & Plan Note (Signed)
Ordered vitamin d pending results.   

## 2022-08-06 NOTE — Assessment & Plan Note (Signed)
Pt advised to work on diet and exercise as tolerated  

## 2022-08-06 NOTE — Assessment & Plan Note (Signed)
Elevated pth, also low vitamin D starting supplementation may decrease pth likely.  Although also consider kidney cause.  Declining GFR, pt was seeing nephrology in the past and they retired.  Advised will refer to nephrology but will obtain u/s renal to r/o obstruction/struture abn

## 2022-08-06 NOTE — Assessment & Plan Note (Signed)
Recommendation for daily otc vitamin B12 1000 mcg  B12 injection in office today , will complete 2 more once monthly for total of 3

## 2022-08-06 NOTE — Patient Instructions (Addendum)
I have sent an electronic order over to your preferred location for the following:   []   2D Mammogram  [x]   3D Mammogram  []   Bone Density   Please give this center a call to get scheduled at your convenience.  [x]   [facility]  [address] [place] [address]  [phone]  Make sure to wear two piece  clothing  No lotions powders or deodorants the day of the appointment Make sure to bring picture ID and insurance card.  Bring list of medications you are currently taking including any supplements.    ------------------------------------  Pravastatin to start for high cholesterol.    ------------------------------------  A referral was placed today for kidney doctor.  Please let us know if you have not heard back within 2 weeks about the referral.   ------------------------------------ I have sent in your order electronically for the following: for an ultrasound of the kidneys  at this location below. Please call to schedule the appointment at your convenience  [facility] outpatient imaging center [address], [place] [address] Phone [phone]  8-5 pm    ------------------------------------ An order was placed for cologuard. This will be mailed to your home.

## 2022-08-20 ENCOUNTER — Ambulatory Visit
Admission: RE | Admit: 2022-08-20 | Discharge: 2022-08-20 | Disposition: A | Discharge status: Home / Self Care | Payer: 59 | Source: Ambulatory Visit | Attending: Family | Admitting: Family

## 2022-08-20 DIAGNOSIS — N1832 Chronic kidney disease, stage 3b: Secondary | ICD-10-CM | POA: Insufficient documentation

## 2022-08-20 DIAGNOSIS — R7989 Other specified abnormal findings of blood chemistry: Secondary | ICD-10-CM | POA: Insufficient documentation

## 2022-08-20 DIAGNOSIS — R944 Abnormal results of kidney function studies: Secondary | ICD-10-CM | POA: Insufficient documentation

## 2022-08-21 ENCOUNTER — Other Ambulatory Visit: Payer: Self-pay | Admitting: Family

## 2022-08-21 DIAGNOSIS — N1832 Chronic kidney disease, stage 3b: Secondary | ICD-10-CM

## 2022-08-24 ENCOUNTER — Other Ambulatory Visit (INDEPENDENT_AMBULATORY_CARE_PROVIDER_SITE_OTHER): Payer: 59

## 2022-08-24 ENCOUNTER — Telehealth: Payer: Self-pay | Admitting: Family

## 2022-08-24 DIAGNOSIS — E782 Mixed hyperlipidemia: Secondary | ICD-10-CM

## 2022-08-24 DIAGNOSIS — R350 Frequency of micturition: Secondary | ICD-10-CM

## 2022-08-24 DIAGNOSIS — E538 Deficiency of other specified B group vitamins: Secondary | ICD-10-CM

## 2022-08-24 DIAGNOSIS — R7989 Other specified abnormal findings of blood chemistry: Secondary | ICD-10-CM

## 2022-08-24 DIAGNOSIS — N1832 Chronic kidney disease, stage 3b: Secondary | ICD-10-CM | POA: Diagnosis not present

## 2022-08-24 DIAGNOSIS — E559 Vitamin D deficiency, unspecified: Secondary | ICD-10-CM

## 2022-08-24 NOTE — Addendum Note (Signed)
Addended by: Vincenza Hews on: 08/24/2022 03:21 PM   Modules accepted: Orders

## 2022-08-24 NOTE — Addendum Note (Signed)
Addended by: Vincenza Hews on: 08/24/2022 03:04 PM   Modules accepted: Orders

## 2022-08-24 NOTE — Telephone Encounter (Signed)
Patient is scheduled to come in for labs today at 315,she would like to know if an order for an urine culture can be placed for her to leave a sample,because she thinks that she may have a uti?

## 2022-08-24 NOTE — Telephone Encounter (Signed)
Left message to return call to our office.  

## 2022-08-24 NOTE — Telephone Encounter (Signed)
Since tabitha is out of office she will need to be seen by a provider for the urinary symptoms or go to a local urgent care

## 2022-08-24 NOTE — Addendum Note (Signed)
Addended by: Vincenza Hews on: 08/24/2022 03:03 PM   Modules accepted: Orders

## 2022-08-24 NOTE — Addendum Note (Signed)
Addended by: Vincenza Hews on: 08/24/2022 03:41 PM   Modules accepted: Orders

## 2022-08-27 LAB — BASIC METABOLIC PANEL
BUN/Creatinine Ratio: 11 (ref 9–23)
BUN: 14 mg/dL (ref 6–24)
CO2: 22 mmol/L (ref 20–29)
Calcium: 9 mg/dL (ref 8.7–10.2)
Chloride: 106 mmol/L (ref 96–106)
Creatinine, Ser: 1.27 mg/dL — ABNORMAL HIGH (ref 0.57–1.00)
Glucose: 144 mg/dL — ABNORMAL HIGH (ref 70–99)
Potassium: 4 mmol/L (ref 3.5–5.2)
Sodium: 143 mmol/L (ref 134–144)
eGFR: 49 mL/min/{1.73_m2} — ABNORMAL LOW (ref >59)

## 2022-08-27 LAB — PTH, INTACT AND CALCIUM: PTH: 69 pg/mL — ABNORMAL HIGH (ref 15–65)

## 2022-08-27 LAB — VITAMIN D 25 HYDROXY (VIT D DEFICIENCY, FRACTURES): Vit D, 25-Hydroxy: 12.4 ng/mL — ABNORMAL LOW (ref 30.0–100.0)

## 2022-08-27 LAB — LIPID PANEL
Chol/HDL Ratio: 5.3 ratio — ABNORMAL HIGH (ref 0.0–4.4)
Cholesterol, Total: 202 mg/dL — ABNORMAL HIGH (ref 100–199)
HDL: 38 mg/dL — ABNORMAL LOW (ref >39)
LDL Chol Calc (NIH): 115 mg/dL — ABNORMAL HIGH (ref 0–99)
Triglycerides: 280 mg/dL — ABNORMAL HIGH (ref 0–149)
VLDL Cholesterol Cal: 49 mg/dL — ABNORMAL HIGH (ref 5–40)

## 2022-08-27 LAB — VITAMIN B12: Vitamin B-12: 276 pg/mL (ref 232–1245)

## 2022-08-27 NOTE — Telephone Encounter (Signed)
LMTCB

## 2022-08-27 NOTE — Progress Notes (Signed)
Kidneys stable.  Cholesterol a bit high, coming down a slight bit from one month ago.  Work on low cholesterol diet exercise as tolerated.  PTH is stable.  Are you taking daily vitamin D? What dose if yes? Vitamin D is still very low hence PTH is high...  B12 is on the lower end if not taking start b12 1000 mcg over the counter.

## 2022-08-27 NOTE — Addendum Note (Signed)
Addended by: Mort Sawyers on: 08/27/2022 02:40 PM   Modules accepted: Orders

## 2022-08-27 NOTE — Telephone Encounter (Signed)
Please let pt know that if she is still having urinary sx she can come drop off a urine. I have placed a future order.

## 2022-08-28 NOTE — Telephone Encounter (Signed)
Attempted to call pt, unable to leave a message.

## 2022-08-29 NOTE — Telephone Encounter (Addendum)
Attempted to contact pt. There was no answer and no option to leave a message.

## 2022-08-29 NOTE — Telephone Encounter (Signed)
Unable to reach patient. Unable to leave voicemail.  

## 2022-08-30 NOTE — Telephone Encounter (Signed)
She has already responded to me in [place].

## 2022-08-30 NOTE — Telephone Encounter (Signed)
Did not see any MyChart message in patients chart addressing urine symptoms. Called and spoke with patient regarding lab results and asked if still having any urine symptoms. Patient stated she is not experiencing any symptoms currently.

## 2022-09-06 ENCOUNTER — Ambulatory Visit (INDEPENDENT_AMBULATORY_CARE_PROVIDER_SITE_OTHER): Payer: 59

## 2022-09-06 DIAGNOSIS — E559 Vitamin D deficiency, unspecified: Secondary | ICD-10-CM

## 2022-09-06 MED ORDER — CYANOCOBALAMIN 1000 MCG/ML IJ SOLN
1000.0000 ug | Freq: Once | INTRAMUSCULAR | Status: AC
  Administered 2022-09-06: 1000 ug via INTRAMUSCULAR

## 2022-09-06 NOTE — Progress Notes (Signed)
Per orders of Mort Sawyers, NP, injection of vitamin b 12  given by Lewanda Rife in left deltoid. Patient tolerated injection well. Patient will make appointment for 1 month.

## 2022-10-09 ENCOUNTER — Ambulatory Visit: Payer: 59

## 2022-10-31 ENCOUNTER — Telehealth: Payer: Self-pay | Admitting: Family

## 2022-10-31 NOTE — Telephone Encounter (Signed)
Mort Sawyers, FNP  P Dugal Pool Not sure why patient has lab appt, don't think that she needs one. Please call pt and ask why? If was more patient vs me then she can cancel labs and f/u in six months from last appt. ------------------------------------ Attempted to contact pt. There was no answer and no option to leave a message. Will try back.

## 2022-11-01 NOTE — Telephone Encounter (Signed)
Attempted to contact pt. There was no answer and no option to leave a message. Will try back.

## 2022-11-02 NOTE — Telephone Encounter (Signed)
Attempted to contact pt. There was no answer and no option to leave a message.

## 2022-11-07 ENCOUNTER — Other Ambulatory Visit: Payer: 59

## 2022-12-12 ENCOUNTER — Other Ambulatory Visit: Payer: Self-pay | Admitting: Internal Medicine

## 2022-12-12 DIAGNOSIS — E039 Hypothyroidism, unspecified: Secondary | ICD-10-CM

## 2023-02-07 ENCOUNTER — Ambulatory Visit: Payer: 59 | Admitting: Family

## 2023-02-08 ENCOUNTER — Encounter: Payer: Self-pay | Admitting: Family

## 2023-03-08 ENCOUNTER — Telehealth: Payer: Self-pay | Admitting: Family

## 2023-03-08 NOTE — Telephone Encounter (Signed)
 Per our records, pt is due for her mammogram. LM for pt to return call.

## 2023-05-27 ENCOUNTER — Other Ambulatory Visit: Payer: Self-pay | Admitting: Internal Medicine

## 2023-05-28 NOTE — Telephone Encounter (Signed)
 lvm for pt to call office to schedule appt.

## 2023-06-23 ENCOUNTER — Other Ambulatory Visit: Payer: Self-pay | Admitting: Family

## 2023-09-09 ENCOUNTER — Other Ambulatory Visit: Payer: Self-pay | Admitting: Family

## 2023-09-09 DIAGNOSIS — E538 Deficiency of other specified B group vitamins: Secondary | ICD-10-CM

## 2023-10-03 ENCOUNTER — Other Ambulatory Visit: Payer: Self-pay | Admitting: Family

## 2023-10-03 DIAGNOSIS — E039 Hypothyroidism, unspecified: Secondary | ICD-10-CM

## 2023-11-21 ENCOUNTER — Encounter: Payer: Self-pay | Admitting: Family

## 2023-11-21 ENCOUNTER — Ambulatory Visit: Attending: Family

## 2023-11-21 ENCOUNTER — Ambulatory Visit: Admitting: Family

## 2023-11-21 VITALS — BP 132/74 | HR 104 | Temp 98.0°F | Ht 69.0 in | Wt 335.0 lb

## 2023-11-21 DIAGNOSIS — E039 Hypothyroidism, unspecified: Secondary | ICD-10-CM

## 2023-11-21 DIAGNOSIS — J4540 Moderate persistent asthma, uncomplicated: Secondary | ICD-10-CM | POA: Diagnosis not present

## 2023-11-21 DIAGNOSIS — E559 Vitamin D deficiency, unspecified: Secondary | ICD-10-CM

## 2023-11-21 DIAGNOSIS — I10 Essential (primary) hypertension: Secondary | ICD-10-CM | POA: Diagnosis not present

## 2023-11-21 DIAGNOSIS — G4733 Obstructive sleep apnea (adult) (pediatric): Secondary | ICD-10-CM

## 2023-11-21 DIAGNOSIS — E538 Deficiency of other specified B group vitamins: Secondary | ICD-10-CM

## 2023-11-21 DIAGNOSIS — R002 Palpitations: Secondary | ICD-10-CM

## 2023-11-21 DIAGNOSIS — J45909 Unspecified asthma, uncomplicated: Secondary | ICD-10-CM | POA: Insufficient documentation

## 2023-11-21 DIAGNOSIS — N1832 Chronic kidney disease, stage 3b: Secondary | ICD-10-CM

## 2023-11-21 DIAGNOSIS — R0609 Other forms of dyspnea: Secondary | ICD-10-CM | POA: Insufficient documentation

## 2023-11-21 DIAGNOSIS — E782 Mixed hyperlipidemia: Secondary | ICD-10-CM | POA: Diagnosis not present

## 2023-11-21 DIAGNOSIS — R7989 Other specified abnormal findings of blood chemistry: Secondary | ICD-10-CM

## 2023-11-21 DIAGNOSIS — K047 Periapical abscess without sinus: Secondary | ICD-10-CM

## 2023-11-21 DIAGNOSIS — D721 Eosinophilia, unspecified: Secondary | ICD-10-CM

## 2023-11-21 MED ORDER — AMOXICILLIN-POT CLAVULANATE 875-125 MG PO TABS: 1.0000 | ORAL_TABLET | Freq: Two times a day (BID) | ORAL | 0 refills | Status: AC

## 2023-11-21 MED ORDER — ALBUTEROL SULFATE HFA 108 (90 BASE) MCG/ACT IN AERS: INHALATION_SPRAY | RESPIRATORY_TRACT | 1 refills | Status: AC

## 2023-11-21 MED ORDER — LEVOTHYROXINE SODIUM 150 MCG PO TABS: 150.0000 ug | ORAL_TABLET | Freq: Every day | ORAL | 3 refills | Status: AC

## 2023-11-21 MED ORDER — PRAVASTATIN SODIUM 10 MG PO TABS: 10.0000 mg | ORAL_TABLET | Freq: Every day | ORAL | 3 refills | Status: AC

## 2023-11-21 MED ORDER — VALSARTAN 80 MG PO TABS: 80.0000 mg | ORAL_TABLET | Freq: Every day | ORAL | 3 refills | Status: AC

## 2023-11-21 MED ORDER — QVAR REDIHALER 80 MCG/ACT IN AERB: 2.0000 | INHALATION_SPRAY | Freq: Two times a day (BID) | RESPIRATORY_TRACT | 5 refills | Status: AC

## 2023-11-21 NOTE — Progress Notes (Signed)
 `  Established Patient Office Visit  Subjective:      CC:  Chief Complaint  Patient presents with   Medical Management of Chronic Issues    HPI: Sarah Austin is a 58 y.o. female presenting on 11/21/2023 for Medical Management of Chronic Issues   Discussed the use of AI scribe software for clinical note transcription with the patient, who gave verbal consent to proceed.  History of Present Illness Sarah DIGANGI is a 58 year old female with hypertension and thyroid disorder who presents with irregular heartbeat and shortness of breath.  She has been experiencing fluctuations in her blood pressure despite taking valsartan  80 mg once daily. Her home blood pressure monitor has detected an irregular heartbeat, with her heart rate ranging from 100 to 107 beats per minute. She experiences shortness of breath, palpitations, and occasional lightheadedness upon standing. Additionally, she reports tingling in both arms occasionally.  She is currently taking levothyroxine  150 mcg daily for her thyroid condition. She has not been taking her vitamin D  supplements regularly and suspects her B12 levels are low as she feels very tired. She has B12 supplements but has not been taking them.  She has a history of using a Qvar  inhaler, which she found helpful but has run out of. She also takes pravastatin  10 mg daily for cholesterol and Allegra for allergies. She has not been using her CPAP machine for sleep apnea due to discomfort and reports feeling tired during the day.  She mentions a family history of congestive heart failure, which concerns her given her current symptoms. She drinks one to two caffeinated sodas daily.  She has a gum issue that has persisted for a couple of weeks, characterized by tenderness at the bottom of a tooth. She has tried over-the-counter treatments without success and denies having a fever. She has not visited a dentist recently but plans to do  so.         Social history:  Relevant past medical, surgical, family and social history reviewed and updated as indicated. Interim medical history since our last visit reviewed.  Allergies and medications reviewed and updated.  DATA REVIEWED: CHART IN EPIC     ROS: Negative unless specifically indicated above in HPI.    Current Outpatient Medications:    amoxicillin-clavulanate (AUGMENTIN) 875-125 MG tablet, Take 1 tablet by mouth 2 (two) times daily., Disp: 20 tablet, Rfl: 0   Cholecalciferol (VITAMIN D3) 25 MCG (1000 UT) CAPS, Take 1 capsule (1,000 Units total) by mouth daily., Disp: 90 capsule, Rfl: 3   cyanocobalamin  ([employer] VITAMIN B12) 1000 MCG tablet, TAKE 1 TABLET BY MOUTH EVERY DAY, Disp: 90 tablet, Rfl: 3   fexofenadine (ALLEGRA) 180 MG tablet, Take 180 mg by mouth daily., Disp: , Rfl:    Multiple Vitamins-Minerals (MULTIVITAMIN WOMEN 50+ PO), Take by mouth., Disp: , Rfl:    albuterol  (VENTOLIN  HFA) 108 (90 Base) MCG/ACT inhaler, TAKE 2 PUFFS BY MOUTH EVERY 6 HOURS AS NEEDED FOR WHEEZE OR SHORTNESS OF BREATH, Disp: 6.7 each, Rfl: 1   beclomethasone (QVAR  REDIHALER) 80 MCG/ACT inhaler, Inhale 2 puffs into the lungs 2 (two) times daily., Disp: 10.6 g, Rfl: 5   levothyroxine  (SYNTHROID ) 150 MCG tablet, Take 1 tablet (150 mcg total) by mouth daily before breakfast., Disp: 90 tablet, Rfl: 3   pravastatin  (PRAVACHOL ) 10 MG tablet, Take 1 tablet (10 mg total) by mouth daily., Disp: 90 tablet, Rfl: 3   valsartan  (DIOVAN ) 80 MG tablet, Take 1 tablet (80 mg  total) by mouth daily., Disp: 90 tablet, Rfl: 3        Objective:        BP 132/74 (BP Location: Left Arm, Patient Position: Sitting, Cuff Size: Large)   Pulse (!) 104   Temp 98 F (36.7 C) (Temporal)   Ht 5' 9 (1.753 m)   Wt (!) 335 lb (152 kg)   LMP 04/02/2014   SpO2 95%   BMI 49.47 kg/m   Physical Exam VITALS: P- 88 HEENT: Oral cavity shows irritation, possible gingivitis. CARDIOVASCULAR: Normal sinus  rhythm, heart rate 88 bpm. EXTREMITIES: Varicose veins present.  Wt Readings from Last 3 Encounters:  11/21/23 (!) 335 lb (152 kg)  08/06/22 (!) 337 lb (152.9 kg)  07/12/22 (!) 337 lb (152.9 kg)    Physical Exam Vitals reviewed.  Constitutional:      General: She is not in acute distress.    Appearance: Normal appearance. She is normal weight. She is not ill-appearing, toxic-appearing or diaphoretic.  HENT:     Head: Normocephalic.     Mouth/Throat:     Dentition: Dental tenderness and gingival swelling present.  Cardiovascular:     Rate and Rhythm: Normal rate and regular rhythm.     Heart sounds: No murmur heard.    Comments: Rle varicosities Pulmonary:     Effort: Pulmonary effort is normal.  Musculoskeletal:        General: Normal range of motion.     Right lower leg: No edema.     Left lower leg: No edema.  Neurological:     General: No focal deficit present.     Mental Status: She is alert and oriented to person, place, and time. Mental status is at baseline.  Psychiatric:        Mood and Affect: Mood normal.        Behavior: Behavior normal.        Thought Content: Thought content normal.        Judgment: Judgment normal.          Results RADIOLOGY Kidney ultrasound: Left simple kidney cyst, benign (Fall 2024)  DIAGNOSTIC EKG: Normal sinus rhythm, heart rate 88 bpm, QT interval 362 ms (11/21/2023)  Assessment & Plan:   Assessment and Plan Assessment & Plan Adult Wellness Visit Routine wellness visit with emphasis on regular follow-up and health maintenance. - Scheduled a physical exam  Essential hypertension with palpitations and tachycardia Blood pressure is variable with irregular heartbeat detected. Heart rate ranges from 100 to 107 bpm. Symptoms include shortness of breath, lightheadedness, and tingling in arms. EKG shows normal sinus rhythm with heart rate of 88 bpm. Differential includes thyroid dysfunction and untreated sleep apnea. Family  history of congestive heart failure. - Ordered heart monitor (zio) for two weeks - Ordered echocardiogram - Advised to reduce caffeine intake - Will consider cardiology referral based on test results  Hypothyroidism Currently on levothyroxine  150 mcg daily. Concerns about potential over-suppression contributing to high heart rate. - Ordered TSH level - Continue levothyroxine  150 mcg daily  Mixed hyperlipidemia Currently on pravastatin  10 mg daily. - Refilled pravastatin  10 mg daily  Chronic kidney disease, stage 3b Previous kidney ultrasound showed a benign simple cyst otherwise normal. Current labs to assess kidney function. Could consider renal duplex - Ordered labs to assess kidney function - Will consider nephrology referral if labs indicate progression  Obstructive sleep apnea Non-compliance with CPAP due to discomfort. Potential contribution to arrhythmia and fatigue. - Will consider reevaluation of CPAP  use  Moderate persistent asthma Currently using Qvar  inhaler, which ran out. Previous use was beneficial. - Refilled Qvar  inhaler  Vitamin D  deficiency Not taking vitamin D  supplements. Previous low levels noted. - Ordered vitamin D  level - Advised to start over-the-counter vitamin D  supplementation  Deficiency of B group vitamins Reports fatigue and has B12 supplements but not taking them. - Ordered B12 level - Advised to start B12 supplementation  Periapical abscess / tooth infection Gum irritation and tenderness noted, possibly gingivitis. No fever reported. - Prescribed antibiotic for tooth infection - Advised to see a dentist for further evaluation        Return in about 3 months (around 02/21/2024) for f/u CPE.     Ginger Patrick, MSN, APRN, FNP-C [facility]

## 2023-11-22 ENCOUNTER — Ambulatory Visit: Payer: Self-pay | Admitting: Family

## 2023-11-22 DIAGNOSIS — E559 Vitamin D deficiency, unspecified: Secondary | ICD-10-CM

## 2023-11-23 LAB — MICROALBUMIN / CREATININE URINE RATIO
Creatinine, Urine: 259.4 mg/dL
Microalb/Creat Ratio: 13 mg/g{creat} (ref 0–29)
Microalbumin, Urine: 32.8 ug/mL

## 2023-11-23 LAB — COMPREHENSIVE METABOLIC PANEL WITH GFR
ALT: 20 IU/L (ref 0–32)
AST: 20 IU/L (ref 0–40)
Albumin: 4.1 g/dL (ref 3.8–4.9)
Alkaline Phosphatase: 93 IU/L (ref 49–135)
BUN/Creatinine Ratio: 15 (ref 9–23)
BUN: 18 mg/dL (ref 6–24)
Bilirubin Total: 0.5 mg/dL (ref 0.0–1.2)
CO2: 23 mmol/L (ref 20–29)
Calcium: 8.5 mg/dL — ABNORMAL LOW (ref 8.7–10.2)
Chloride: 105 mmol/L (ref 96–106)
Creatinine, Ser: 1.2 mg/dL — ABNORMAL HIGH (ref 0.57–1.00)
Globulin, Total: 2.8 g/dL (ref 1.5–4.5)
Glucose: 111 mg/dL — ABNORMAL HIGH (ref 70–99)
Potassium: 4.4 mmol/L (ref 3.5–5.2)
Sodium: 141 mmol/L (ref 134–144)
Total Protein: 6.9 g/dL (ref 6.0–8.5)
eGFR: 52 mL/min/1.73 — ABNORMAL LOW (ref >59)

## 2023-11-23 LAB — T4, FREE: Free T4: 1.25 ng/dL (ref 0.82–1.77)

## 2023-11-23 LAB — HEMOGLOBIN A1C
Est. average glucose Bld gHb Est-mCnc: 120 mg/dL
Hgb A1c MFr Bld: 5.8 % — ABNORMAL HIGH (ref 4.8–5.6)

## 2023-11-23 LAB — LIPID PANEL
Chol/HDL Ratio: 5 ratio — ABNORMAL HIGH (ref 0.0–4.4)
Cholesterol, Total: 206 mg/dL — ABNORMAL HIGH (ref 100–199)
HDL: 41 mg/dL (ref >39)
LDL Chol Calc (NIH): 142 mg/dL — ABNORMAL HIGH (ref 0–99)
Triglycerides: 128 mg/dL (ref 0–149)
VLDL Cholesterol Cal: 23 mg/dL (ref 5–40)

## 2023-11-23 LAB — CBC
Hematocrit: 39.2 % (ref 34.0–46.6)
Hemoglobin: 12.9 g/dL (ref 11.1–15.9)
MCH: 30 pg (ref 26.6–33.0)
MCHC: 32.9 g/dL (ref 31.5–35.7)
MCV: 91 fL (ref 79–97)
Platelets: 169 x10E3/uL (ref 150–450)
RBC: 4.3 x10E6/uL (ref 3.77–5.28)
RDW: 13 % (ref 11.7–15.4)
WBC: 7 x10E3/uL (ref 3.4–10.8)

## 2023-11-23 LAB — VITAMIN B12: Vitamin B-12: 272 pg/mL (ref 232–1245)

## 2023-11-23 LAB — TSH: TSH: 2.57 u[IU]/mL (ref 0.450–4.500)

## 2023-11-23 LAB — PARATHYROID HORMONE, INTACT (NO CA): PTH: 38 pg/mL (ref 15–65)

## 2023-11-23 LAB — T3, FREE: T3, Free: 2.6 pg/mL (ref 2.0–4.4)

## 2023-11-23 LAB — VITAMIN D 25 HYDROXY (VIT D DEFICIENCY, FRACTURES): Vit D, 25-Hydroxy: 16.5 ng/mL — ABNORMAL LOW (ref 30.0–100.0)

## 2023-11-25 MED ORDER — CHOLECALCIFEROL 1.25 MG (50000 UT) PO TABS: 1.0000 | ORAL_TABLET | ORAL | 0 refills | Status: AC

## 2023-11-25 NOTE — Progress Notes (Signed)
 Your kidneys remain stable however still on the lower side of normal.  Thankfully your kidney ultrasound in 2024 was unremarkable and your microalbumin was negative which is reassuring. For this we will continue to watch the kidney level trend and since stable our goal is just to keep blood pressure <130/80 so that we do not further cause damage   Your A1c has bumped upwards a bit to the prediabetic level.  Please try to work on a diabetic like diet and exercise as tolerated.   Cholesterol has also taken a high little jump.  Diabetic/low cholesterol diet will be hard over the holidays but will be beneficial to be mindful of.   I'd recommend a 3 month follow up to repeat those labs and see if we can see some improvement.   Your vitamin D  was a bit on the lower range. I will send an RX for vitamin D3 50,000 IU which you will take once weekly for 12 weeks. Once RX is complete, please continue over the counter Vitamin D3 2000 IU once daily.

## 2023-11-27 ENCOUNTER — Encounter: Payer: Self-pay | Admitting: Family

## 2024-01-06 ENCOUNTER — Other Ambulatory Visit

## 2024-01-06 ENCOUNTER — Telehealth: Payer: Self-pay | Admitting: Family

## 2024-01-06 NOTE — Telephone Encounter (Signed)
 LM for pt to return my call

## 2024-01-06 NOTE — Telephone Encounter (Signed)
 Checking in  Have you started the heart rate monitor?  Have you scheduled the echocardiogram?

## 2024-01-07 NOTE — Telephone Encounter (Signed)
 LM for pt to return my call

## 2024-01-08 NOTE — Telephone Encounter (Signed)
 LM for pt to return my call

## 2024-01-09 DIAGNOSIS — I10 Essential (primary) hypertension: Secondary | ICD-10-CM | POA: Diagnosis not present

## 2024-01-09 DIAGNOSIS — R002 Palpitations: Secondary | ICD-10-CM

## 2024-01-22 NOTE — Progress Notes (Signed)
 Your heart monitor was normal which is reassuring

## 2024-01-27 ENCOUNTER — Telehealth: Payer: Self-pay | Admitting: Family

## 2024-01-27 NOTE — Telephone Encounter (Signed)
 Your zio monitor was reassuring, normal findings.  I see you have cancelled your echocardiogram.  Are you still experiencing palpitations?

## 2024-01-27 NOTE — Telephone Encounter (Signed)
-----   Message from [place] sent at 11/21/2023  9:15 AM EST ----- Echocardiogram and zio monitor  Are they done? Should we refer to cardiology?

## 2024-01-27 NOTE — Telephone Encounter (Signed)
 LM for pt to return my call

## 2024-01-29 NOTE — Telephone Encounter (Signed)
 LM for pt to return my call

## 2024-01-30 NOTE — Telephone Encounter (Signed)
 LM for pt to return my call

## 2024-03-24 ENCOUNTER — Encounter: Admitting: Family
